# Patient Record
Sex: Female | Born: 1940
Health system: Southern US, Community
[De-identification: ages and names within clinical notes are randomized; demographics above are authoritative.]

## PROBLEM LIST (undated history)

## (undated) DIAGNOSIS — E039 Hypothyroidism, unspecified: Secondary | ICD-10-CM

## (undated) DIAGNOSIS — J449 Chronic obstructive pulmonary disease, unspecified: Secondary | ICD-10-CM

## (undated) DIAGNOSIS — E785 Hyperlipidemia, unspecified: Secondary | ICD-10-CM

## (undated) HISTORY — PX: TUBAL LIGATION: SHX77

## (undated) HISTORY — DX: Hypothyroidism, unspecified: E03.9

---

## 2011-08-07 DIAGNOSIS — E039 Hypothyroidism, unspecified: Secondary | ICD-10-CM | POA: Diagnosis not present

## 2011-08-07 DIAGNOSIS — M810 Age-related osteoporosis without current pathological fracture: Secondary | ICD-10-CM | POA: Diagnosis not present

## 2011-08-07 DIAGNOSIS — E038 Other specified hypothyroidism: Secondary | ICD-10-CM | POA: Diagnosis not present

## 2011-08-07 DIAGNOSIS — E782 Mixed hyperlipidemia: Secondary | ICD-10-CM | POA: Diagnosis not present

## 2011-11-05 DIAGNOSIS — E039 Hypothyroidism, unspecified: Secondary | ICD-10-CM | POA: Diagnosis not present

## 2011-11-05 DIAGNOSIS — E782 Mixed hyperlipidemia: Secondary | ICD-10-CM | POA: Diagnosis not present

## 2011-11-05 DIAGNOSIS — I10 Essential (primary) hypertension: Secondary | ICD-10-CM | POA: Diagnosis not present

## 2012-02-04 DIAGNOSIS — I10 Essential (primary) hypertension: Secondary | ICD-10-CM | POA: Diagnosis not present

## 2012-02-14 DIAGNOSIS — L570 Actinic keratosis: Secondary | ICD-10-CM | POA: Diagnosis not present

## 2012-05-05 DIAGNOSIS — E782 Mixed hyperlipidemia: Secondary | ICD-10-CM | POA: Diagnosis not present

## 2012-05-05 DIAGNOSIS — I10 Essential (primary) hypertension: Secondary | ICD-10-CM | POA: Diagnosis not present

## 2012-05-05 DIAGNOSIS — E039 Hypothyroidism, unspecified: Secondary | ICD-10-CM | POA: Diagnosis not present

## 2012-08-05 DIAGNOSIS — Z0000 Encounter for general adult medical examination without abnormal findings: Secondary | ICD-10-CM | POA: Diagnosis not present

## 2012-08-05 DIAGNOSIS — I10 Essential (primary) hypertension: Secondary | ICD-10-CM | POA: Diagnosis not present

## 2012-11-03 DIAGNOSIS — I10 Essential (primary) hypertension: Secondary | ICD-10-CM | POA: Diagnosis not present

## 2012-11-03 DIAGNOSIS — E782 Mixed hyperlipidemia: Secondary | ICD-10-CM | POA: Diagnosis not present

## 2012-11-03 DIAGNOSIS — E039 Hypothyroidism, unspecified: Secondary | ICD-10-CM | POA: Diagnosis not present

## 2013-02-12 DIAGNOSIS — E782 Mixed hyperlipidemia: Secondary | ICD-10-CM | POA: Diagnosis not present

## 2013-05-15 DIAGNOSIS — E782 Mixed hyperlipidemia: Secondary | ICD-10-CM | POA: Diagnosis not present

## 2013-08-14 DIAGNOSIS — E782 Mixed hyperlipidemia: Secondary | ICD-10-CM | POA: Diagnosis not present

## 2013-08-14 DIAGNOSIS — Z0000 Encounter for general adult medical examination without abnormal findings: Secondary | ICD-10-CM | POA: Diagnosis not present

## 2013-11-26 DIAGNOSIS — E038 Other specified hypothyroidism: Secondary | ICD-10-CM | POA: Diagnosis not present

## 2013-11-26 DIAGNOSIS — M549 Dorsalgia, unspecified: Secondary | ICD-10-CM | POA: Diagnosis not present

## 2014-02-26 DIAGNOSIS — J309 Allergic rhinitis, unspecified: Secondary | ICD-10-CM | POA: Diagnosis not present

## 2014-02-26 DIAGNOSIS — I10 Essential (primary) hypertension: Secondary | ICD-10-CM | POA: Diagnosis not present

## 2014-03-02 DIAGNOSIS — E782 Mixed hyperlipidemia: Secondary | ICD-10-CM | POA: Diagnosis not present

## 2014-03-02 DIAGNOSIS — I10 Essential (primary) hypertension: Secondary | ICD-10-CM | POA: Diagnosis not present

## 2014-05-28 DIAGNOSIS — M810 Age-related osteoporosis without current pathological fracture: Secondary | ICD-10-CM | POA: Diagnosis not present

## 2014-05-28 DIAGNOSIS — I10 Essential (primary) hypertension: Secondary | ICD-10-CM | POA: Diagnosis not present

## 2014-05-28 DIAGNOSIS — E039 Hypothyroidism, unspecified: Secondary | ICD-10-CM | POA: Diagnosis not present

## 2014-06-21 DIAGNOSIS — E2839 Other primary ovarian failure: Secondary | ICD-10-CM | POA: Diagnosis not present

## 2014-06-21 DIAGNOSIS — E780 Pure hypercholesterolemia: Secondary | ICD-10-CM | POA: Diagnosis not present

## 2014-06-21 DIAGNOSIS — N959 Unspecified menopausal and perimenopausal disorder: Secondary | ICD-10-CM | POA: Diagnosis not present

## 2014-06-21 DIAGNOSIS — E039 Hypothyroidism, unspecified: Secondary | ICD-10-CM | POA: Diagnosis not present

## 2014-06-21 DIAGNOSIS — M810 Age-related osteoporosis without current pathological fracture: Secondary | ICD-10-CM | POA: Diagnosis not present

## 2014-06-21 DIAGNOSIS — M85852 Other specified disorders of bone density and structure, left thigh: Secondary | ICD-10-CM | POA: Diagnosis not present

## 2014-06-21 DIAGNOSIS — Z720 Tobacco use: Secondary | ICD-10-CM | POA: Diagnosis not present

## 2014-06-21 DIAGNOSIS — M85851 Other specified disorders of bone density and structure, right thigh: Secondary | ICD-10-CM | POA: Diagnosis not present

## 2014-08-30 DIAGNOSIS — Z1389 Encounter for screening for other disorder: Secondary | ICD-10-CM | POA: Diagnosis not present

## 2014-08-30 DIAGNOSIS — E039 Hypothyroidism, unspecified: Secondary | ICD-10-CM | POA: Diagnosis not present

## 2014-08-30 DIAGNOSIS — M810 Age-related osteoporosis without current pathological fracture: Secondary | ICD-10-CM | POA: Diagnosis not present

## 2014-08-30 DIAGNOSIS — I10 Essential (primary) hypertension: Secondary | ICD-10-CM | POA: Diagnosis not present

## 2014-08-30 DIAGNOSIS — Z0000 Encounter for general adult medical examination without abnormal findings: Secondary | ICD-10-CM | POA: Diagnosis not present

## 2014-09-21 DIAGNOSIS — M810 Age-related osteoporosis without current pathological fracture: Secondary | ICD-10-CM | POA: Diagnosis not present

## 2014-12-20 DIAGNOSIS — E039 Hypothyroidism, unspecified: Secondary | ICD-10-CM | POA: Diagnosis not present

## 2014-12-20 DIAGNOSIS — E784 Other hyperlipidemia: Secondary | ICD-10-CM | POA: Diagnosis not present

## 2014-12-20 DIAGNOSIS — I10 Essential (primary) hypertension: Secondary | ICD-10-CM | POA: Diagnosis not present

## 2015-03-29 DIAGNOSIS — M810 Age-related osteoporosis without current pathological fracture: Secondary | ICD-10-CM | POA: Diagnosis not present

## 2015-05-26 DIAGNOSIS — I10 Essential (primary) hypertension: Secondary | ICD-10-CM | POA: Diagnosis not present

## 2015-05-26 DIAGNOSIS — E784 Other hyperlipidemia: Secondary | ICD-10-CM | POA: Diagnosis not present

## 2015-05-26 DIAGNOSIS — E038 Other specified hypothyroidism: Secondary | ICD-10-CM | POA: Diagnosis not present

## 2015-05-26 DIAGNOSIS — M818 Other osteoporosis without current pathological fracture: Secondary | ICD-10-CM | POA: Diagnosis not present

## 2015-05-27 DIAGNOSIS — Z23 Encounter for immunization: Secondary | ICD-10-CM | POA: Diagnosis not present

## 2015-09-06 DIAGNOSIS — Z1389 Encounter for screening for other disorder: Secondary | ICD-10-CM | POA: Diagnosis not present

## 2015-09-06 DIAGNOSIS — E784 Other hyperlipidemia: Secondary | ICD-10-CM | POA: Diagnosis not present

## 2015-09-06 DIAGNOSIS — Z0000 Encounter for general adult medical examination without abnormal findings: Secondary | ICD-10-CM | POA: Diagnosis not present

## 2015-09-06 DIAGNOSIS — E038 Other specified hypothyroidism: Secondary | ICD-10-CM | POA: Diagnosis not present

## 2015-09-26 DIAGNOSIS — M810 Age-related osteoporosis without current pathological fracture: Secondary | ICD-10-CM | POA: Diagnosis not present

## 2015-12-07 DIAGNOSIS — E038 Other specified hypothyroidism: Secondary | ICD-10-CM | POA: Diagnosis not present

## 2015-12-07 DIAGNOSIS — E784 Other hyperlipidemia: Secondary | ICD-10-CM | POA: Diagnosis not present

## 2015-12-07 DIAGNOSIS — I10 Essential (primary) hypertension: Secondary | ICD-10-CM | POA: Diagnosis not present

## 2015-12-08 DIAGNOSIS — E038 Other specified hypothyroidism: Secondary | ICD-10-CM | POA: Diagnosis not present

## 2015-12-08 DIAGNOSIS — Z79899 Other long term (current) drug therapy: Secondary | ICD-10-CM | POA: Diagnosis not present

## 2015-12-08 DIAGNOSIS — E784 Other hyperlipidemia: Secondary | ICD-10-CM | POA: Diagnosis not present

## 2016-01-04 DIAGNOSIS — E784 Other hyperlipidemia: Secondary | ICD-10-CM | POA: Diagnosis not present

## 2016-01-04 DIAGNOSIS — I10 Essential (primary) hypertension: Secondary | ICD-10-CM | POA: Diagnosis not present

## 2016-01-04 DIAGNOSIS — E038 Other specified hypothyroidism: Secondary | ICD-10-CM | POA: Diagnosis not present

## 2016-01-26 DIAGNOSIS — I10 Essential (primary) hypertension: Secondary | ICD-10-CM | POA: Diagnosis not present

## 2016-01-26 DIAGNOSIS — E784 Other hyperlipidemia: Secondary | ICD-10-CM | POA: Diagnosis not present

## 2016-01-26 DIAGNOSIS — E038 Other specified hypothyroidism: Secondary | ICD-10-CM | POA: Diagnosis not present

## 2016-02-28 DIAGNOSIS — Z23 Encounter for immunization: Secondary | ICD-10-CM | POA: Diagnosis not present

## 2016-03-01 DIAGNOSIS — I10 Essential (primary) hypertension: Secondary | ICD-10-CM | POA: Diagnosis not present

## 2016-03-01 DIAGNOSIS — E784 Other hyperlipidemia: Secondary | ICD-10-CM | POA: Diagnosis not present

## 2016-03-01 DIAGNOSIS — E038 Other specified hypothyroidism: Secondary | ICD-10-CM | POA: Diagnosis not present

## 2016-03-23 DIAGNOSIS — E784 Other hyperlipidemia: Secondary | ICD-10-CM | POA: Diagnosis not present

## 2016-03-23 DIAGNOSIS — M810 Age-related osteoporosis without current pathological fracture: Secondary | ICD-10-CM | POA: Diagnosis not present

## 2016-03-23 DIAGNOSIS — E038 Other specified hypothyroidism: Secondary | ICD-10-CM | POA: Diagnosis not present

## 2016-04-04 DIAGNOSIS — M810 Age-related osteoporosis without current pathological fracture: Secondary | ICD-10-CM | POA: Diagnosis not present

## 2016-04-27 DIAGNOSIS — I10 Essential (primary) hypertension: Secondary | ICD-10-CM | POA: Diagnosis not present

## 2016-04-27 DIAGNOSIS — E784 Other hyperlipidemia: Secondary | ICD-10-CM | POA: Diagnosis not present

## 2016-04-27 DIAGNOSIS — M810 Age-related osteoporosis without current pathological fracture: Secondary | ICD-10-CM | POA: Diagnosis not present

## 2016-04-27 DIAGNOSIS — E038 Other specified hypothyroidism: Secondary | ICD-10-CM | POA: Diagnosis not present

## 2016-04-30 DIAGNOSIS — Z1231 Encounter for screening mammogram for malignant neoplasm of breast: Secondary | ICD-10-CM | POA: Diagnosis not present

## 2016-04-30 DIAGNOSIS — R921 Mammographic calcification found on diagnostic imaging of breast: Secondary | ICD-10-CM | POA: Diagnosis not present

## 2016-06-11 DIAGNOSIS — E038 Other specified hypothyroidism: Secondary | ICD-10-CM | POA: Diagnosis not present

## 2016-06-11 DIAGNOSIS — I10 Essential (primary) hypertension: Secondary | ICD-10-CM | POA: Diagnosis not present

## 2016-06-11 DIAGNOSIS — E784 Other hyperlipidemia: Secondary | ICD-10-CM | POA: Diagnosis not present

## 2016-06-11 DIAGNOSIS — M810 Age-related osteoporosis without current pathological fracture: Secondary | ICD-10-CM | POA: Diagnosis not present

## 2016-06-22 DIAGNOSIS — H40013 Open angle with borderline findings, low risk, bilateral: Secondary | ICD-10-CM | POA: Diagnosis not present

## 2016-07-17 DIAGNOSIS — M810 Age-related osteoporosis without current pathological fracture: Secondary | ICD-10-CM | POA: Diagnosis not present

## 2016-07-17 DIAGNOSIS — E038 Other specified hypothyroidism: Secondary | ICD-10-CM | POA: Diagnosis not present

## 2016-07-17 DIAGNOSIS — E784 Other hyperlipidemia: Secondary | ICD-10-CM | POA: Diagnosis not present

## 2016-08-03 DIAGNOSIS — Z1211 Encounter for screening for malignant neoplasm of colon: Secondary | ICD-10-CM | POA: Diagnosis not present

## 2016-08-16 DIAGNOSIS — M810 Age-related osteoporosis without current pathological fracture: Secondary | ICD-10-CM | POA: Diagnosis not present

## 2016-08-16 DIAGNOSIS — E784 Other hyperlipidemia: Secondary | ICD-10-CM | POA: Diagnosis not present

## 2016-08-16 DIAGNOSIS — E038 Other specified hypothyroidism: Secondary | ICD-10-CM | POA: Diagnosis not present

## 2016-10-09 DIAGNOSIS — M810 Age-related osteoporosis without current pathological fracture: Secondary | ICD-10-CM | POA: Diagnosis not present

## 2016-10-15 DIAGNOSIS — E784 Other hyperlipidemia: Secondary | ICD-10-CM | POA: Diagnosis not present

## 2016-10-15 DIAGNOSIS — E038 Other specified hypothyroidism: Secondary | ICD-10-CM | POA: Diagnosis not present

## 2016-10-15 DIAGNOSIS — R7303 Prediabetes: Secondary | ICD-10-CM | POA: Diagnosis not present

## 2016-10-15 DIAGNOSIS — Z131 Encounter for screening for diabetes mellitus: Secondary | ICD-10-CM | POA: Diagnosis not present

## 2016-10-15 DIAGNOSIS — Z0000 Encounter for general adult medical examination without abnormal findings: Secondary | ICD-10-CM | POA: Diagnosis not present

## 2016-10-15 DIAGNOSIS — M810 Age-related osteoporosis without current pathological fracture: Secondary | ICD-10-CM | POA: Diagnosis not present

## 2016-10-15 DIAGNOSIS — Z1389 Encounter for screening for other disorder: Secondary | ICD-10-CM | POA: Diagnosis not present

## 2016-11-26 DIAGNOSIS — H04123 Dry eye syndrome of bilateral lacrimal glands: Secondary | ICD-10-CM | POA: Diagnosis not present

## 2016-11-26 DIAGNOSIS — H26492 Other secondary cataract, left eye: Secondary | ICD-10-CM | POA: Diagnosis not present

## 2016-11-26 DIAGNOSIS — H26491 Other secondary cataract, right eye: Secondary | ICD-10-CM | POA: Diagnosis not present

## 2016-11-26 DIAGNOSIS — H40023 Open angle with borderline findings, high risk, bilateral: Secondary | ICD-10-CM | POA: Diagnosis not present

## 2016-12-11 DIAGNOSIS — R10811 Right upper quadrant abdominal tenderness: Secondary | ICD-10-CM | POA: Diagnosis not present

## 2016-12-18 DIAGNOSIS — R10811 Right upper quadrant abdominal tenderness: Secondary | ICD-10-CM | POA: Diagnosis not present

## 2016-12-18 DIAGNOSIS — K838 Other specified diseases of biliary tract: Secondary | ICD-10-CM | POA: Diagnosis not present

## 2016-12-18 DIAGNOSIS — R1011 Right upper quadrant pain: Secondary | ICD-10-CM | POA: Diagnosis not present

## 2016-12-28 DIAGNOSIS — H26492 Other secondary cataract, left eye: Secondary | ICD-10-CM | POA: Diagnosis not present

## 2017-01-02 ENCOUNTER — Encounter (INDEPENDENT_AMBULATORY_CARE_PROVIDER_SITE_OTHER): Payer: Self-pay | Admitting: Internal Medicine

## 2017-01-02 ENCOUNTER — Other Ambulatory Visit (INDEPENDENT_AMBULATORY_CARE_PROVIDER_SITE_OTHER): Payer: Self-pay | Admitting: Internal Medicine

## 2017-01-02 ENCOUNTER — Encounter (INDEPENDENT_AMBULATORY_CARE_PROVIDER_SITE_OTHER): Payer: Self-pay

## 2017-01-02 ENCOUNTER — Ambulatory Visit (HOSPITAL_COMMUNITY)
Admission: RE | Admit: 2017-01-02 | Discharge: 2017-01-02 | Disposition: A | Discharge status: Home / Self Care | Payer: Medicare Other | Source: Ambulatory Visit | Attending: Internal Medicine | Admitting: Internal Medicine

## 2017-01-02 ENCOUNTER — Ambulatory Visit (INDEPENDENT_AMBULATORY_CARE_PROVIDER_SITE_OTHER): Payer: Medicare Other | Admitting: Internal Medicine

## 2017-01-02 VITALS — BP 164/80 | HR 72 | Temp 98.1°F | Ht 64.0 in | Wt 143.6 lb

## 2017-01-02 DIAGNOSIS — K8050 Calculus of bile duct without cholangitis or cholecystitis without obstruction: Secondary | ICD-10-CM

## 2017-01-02 DIAGNOSIS — I700 Atherosclerosis of aorta: Secondary | ICD-10-CM | POA: Diagnosis not present

## 2017-01-02 DIAGNOSIS — N281 Cyst of kidney, acquired: Secondary | ICD-10-CM | POA: Diagnosis not present

## 2017-01-02 DIAGNOSIS — R930 Abnormal findings on diagnostic imaging of skull and head, not elsewhere classified: Secondary | ICD-10-CM | POA: Diagnosis not present

## 2017-01-02 DIAGNOSIS — E039 Hypothyroidism, unspecified: Secondary | ICD-10-CM | POA: Insufficient documentation

## 2017-01-02 DIAGNOSIS — K838 Other specified diseases of biliary tract: Secondary | ICD-10-CM | POA: Insufficient documentation

## 2017-01-02 DIAGNOSIS — K449 Diaphragmatic hernia without obstruction or gangrene: Secondary | ICD-10-CM | POA: Diagnosis not present

## 2017-01-02 HISTORY — DX: Hypothyroidism, unspecified: E03.9

## 2017-01-02 LAB — CBC WITH DIFFERENTIAL/PLATELET
BASOS PCT: 1 %
Basophils Absolute: 76 cells/uL (ref 0–200)
EOS ABS: 152 {cells}/uL (ref 15–500)
Eosinophils Relative: 2 %
HEMATOCRIT: 45 % (ref 35.0–45.0)
HEMOGLOBIN: 14.6 g/dL (ref 11.7–15.5)
LYMPHS PCT: 37 %
Lymphs Abs: 2812 cells/uL (ref 850–3900)
MCH: 27.2 pg (ref 27.0–33.0)
MCHC: 32.4 g/dL (ref 32.0–36.0)
MCV: 84 fL (ref 80.0–100.0)
MONO ABS: 760 {cells}/uL (ref 200–950)
MPV: 10 fL (ref 7.5–12.5)
Monocytes Relative: 10 %
Neutro Abs: 3800 cells/uL (ref 1500–7800)
Neutrophils Relative %: 50 %
Platelets: 364 10*3/uL (ref 140–400)
RBC: 5.36 MIL/uL — ABNORMAL HIGH (ref 3.80–5.10)
RDW: 13.3 % (ref 11.0–15.0)
WBC: 7.6 10*3/uL (ref 3.8–10.8)

## 2017-01-02 LAB — HEPATIC FUNCTION PANEL
ALT: 11 U/L (ref 6–29)
AST: 16 U/L (ref 10–35)
Albumin: 4.1 g/dL (ref 3.6–5.1)
Alkaline Phosphatase: 69 U/L (ref 33–130)
BILIRUBIN DIRECT: 0.1 mg/dL (ref <=0.2)
BILIRUBIN INDIRECT: 0.4 mg/dL (ref 0.2–1.2)
Total Bilirubin: 0.5 mg/dL (ref 0.2–1.2)
Total Protein: 6.7 g/dL (ref 6.1–8.1)

## 2017-01-02 LAB — POCT I-STAT CREATININE: Creatinine, Ser: 0.8 mg/dL (ref 0.44–1.00)

## 2017-01-02 MED ORDER — GADOBENATE DIMEGLUMINE 529 MG/ML IV SOLN
15.0000 mL | Freq: Once | INTRAVENOUS | Status: AC | PRN
  Administered 2017-01-02: 14 mL via INTRAVENOUS

## 2017-01-02 NOTE — Progress Notes (Signed)
   Subjective:    Patient ID: Sherry Bridges, female    DOB: 07/27/1940, 76 y.o.   MRN: [id number]  HPI  Referred by Dr. Olena LeatherwoodHasanaj for possible ERCP. She states she not having any pain. A few weeks ago she had epigastric pain after she ate. She has not had any pain in about 2 weeks. When she had the US she states she was not having any pain.  No pain at this time.  Appetite is good. No weight loss.  BMs every other day. Her last colonoscopy was normal by Dr. Gabriel CirrieMason this year.    12/18/2016 US complete Abdomen:  RUQ tenderness Milded dilated CBD with intraluminal stones or sludge. Normal appearance of the GB, liver , pancreas, and spleen. CBC 6.3-7.0  Review of Systems Past Medical History:  Diagnosis Date  . Hypothyroidism 01/02/2017    No past surgical history on file.  No Known Allergies  No current outpatient prescriptions on file prior to visit.   No current facility-administered medications on file prior to visit.    Current Outpatient Prescriptions  Medication Sig Dispense Refill  . atorvastatin (LIPITOR) 40 MG tablet Take 40 mg by mouth daily.    Marland Kitchen. co-enzyme Q-10 30 MG capsule Take 30 mg by mouth 3 (three) times daily.    Marland Kitchen. levothyroxine (SYNTHROID, LEVOTHROID) 100 MCG tablet Take 100 mcg by mouth daily before breakfast.    . Multiple Vitamin (MULTIVITAMIN) tablet Take 1 tablet by mouth daily.    . Omega-3 Fatty Acids (FISH OIL) 1000 MG CAPS Take by mouth.     No current facility-administered medications for this visit.          Objective:   Physical Exam Blood pressure (!) 164/80, pulse 72, temperature 98.1 F (36.7 C), height 5\' 4"  (1.626 m), weight 143 lb 9.6 oz (65.1 kg). Alert and oriented. Skin warm and dry. Oral mucosa is moist.   . Sclera anicteric, conjunctivae is pink. Thyroid not enlarged. No cervical lymphadenopathy. Lungs clear. Heart regular rate and rhythm.  Abdomen is soft. Bowel sounds are positive. No hepatomegaly. No abdominal masses felt. No  tenderness.  No edema to lower extremities.          Assessment & Plan:  Possible stone in CBD. MRI abdomen, CBC and hepatic function.  Further recommendations to follow.

## 2017-01-02 NOTE — Patient Instructions (Signed)
MRI, CBC and Hepatic

## 2017-01-03 ENCOUNTER — Telehealth (INDEPENDENT_AMBULATORY_CARE_PROVIDER_SITE_OTHER): Payer: Self-pay | Admitting: Internal Medicine

## 2017-01-03 ENCOUNTER — Other Ambulatory Visit (INDEPENDENT_AMBULATORY_CARE_PROVIDER_SITE_OTHER): Payer: Self-pay | Admitting: *Deleted

## 2017-01-03 DIAGNOSIS — R748 Abnormal levels of other serum enzymes: Secondary | ICD-10-CM

## 2017-01-03 NOTE — Telephone Encounter (Signed)
3 mth US noted in recall

## 2017-01-03 NOTE — Telephone Encounter (Signed)
Sherry Bridges, Needs US in 3 months

## 2017-01-04 ENCOUNTER — Encounter (INDEPENDENT_AMBULATORY_CARE_PROVIDER_SITE_OTHER): Payer: Self-pay

## 2017-01-23 DIAGNOSIS — E784 Other hyperlipidemia: Secondary | ICD-10-CM | POA: Diagnosis not present

## 2017-01-23 DIAGNOSIS — M810 Age-related osteoporosis without current pathological fracture: Secondary | ICD-10-CM | POA: Diagnosis not present

## 2017-01-23 DIAGNOSIS — E038 Other specified hypothyroidism: Secondary | ICD-10-CM | POA: Diagnosis not present

## 2017-03-21 ENCOUNTER — Encounter (INDEPENDENT_AMBULATORY_CARE_PROVIDER_SITE_OTHER): Payer: Self-pay | Admitting: *Deleted

## 2017-03-21 ENCOUNTER — Other Ambulatory Visit (INDEPENDENT_AMBULATORY_CARE_PROVIDER_SITE_OTHER): Payer: Self-pay | Admitting: *Deleted

## 2017-03-21 DIAGNOSIS — R748 Abnormal levels of other serum enzymes: Secondary | ICD-10-CM

## 2017-03-22 ENCOUNTER — Encounter (INDEPENDENT_AMBULATORY_CARE_PROVIDER_SITE_OTHER): Payer: Self-pay | Admitting: *Deleted

## 2017-04-01 ENCOUNTER — Telehealth (INDEPENDENT_AMBULATORY_CARE_PROVIDER_SITE_OTHER): Payer: Self-pay | Admitting: Internal Medicine

## 2017-04-01 NOTE — Telephone Encounter (Signed)
Patient called and lmoam stating that she received a letter for labs, she stated that she didn't think she was going to follow up on this anymore.  [phone]

## 2017-04-01 NOTE — Telephone Encounter (Signed)
Advised I wanted a follow up and this would be the last one.

## 2017-04-22 DIAGNOSIS — M810 Age-related osteoporosis without current pathological fracture: Secondary | ICD-10-CM | POA: Diagnosis not present

## 2017-05-06 DIAGNOSIS — E038 Other specified hypothyroidism: Secondary | ICD-10-CM | POA: Diagnosis not present

## 2017-05-06 DIAGNOSIS — Z23 Encounter for immunization: Secondary | ICD-10-CM | POA: Diagnosis not present

## 2017-05-06 DIAGNOSIS — M810 Age-related osteoporosis without current pathological fracture: Secondary | ICD-10-CM | POA: Diagnosis not present

## 2017-05-06 DIAGNOSIS — E7849 Other hyperlipidemia: Secondary | ICD-10-CM | POA: Diagnosis not present

## 2017-08-12 DIAGNOSIS — M810 Age-related osteoporosis without current pathological fracture: Secondary | ICD-10-CM | POA: Diagnosis not present

## 2017-08-12 DIAGNOSIS — R7303 Prediabetes: Secondary | ICD-10-CM | POA: Diagnosis not present

## 2017-08-12 DIAGNOSIS — Z131 Encounter for screening for diabetes mellitus: Secondary | ICD-10-CM | POA: Diagnosis not present

## 2017-08-12 DIAGNOSIS — E038 Other specified hypothyroidism: Secondary | ICD-10-CM | POA: Diagnosis not present

## 2017-08-12 DIAGNOSIS — E7849 Other hyperlipidemia: Secondary | ICD-10-CM | POA: Diagnosis not present

## 2017-09-23 DIAGNOSIS — M810 Age-related osteoporosis without current pathological fracture: Secondary | ICD-10-CM | POA: Diagnosis not present

## 2017-09-23 DIAGNOSIS — M8588 Other specified disorders of bone density and structure, other site: Secondary | ICD-10-CM | POA: Diagnosis not present

## 2017-10-30 DIAGNOSIS — M810 Age-related osteoporosis without current pathological fracture: Secondary | ICD-10-CM | POA: Diagnosis not present

## 2017-11-13 DIAGNOSIS — E038 Other specified hypothyroidism: Secondary | ICD-10-CM | POA: Diagnosis not present

## 2017-11-13 DIAGNOSIS — M810 Age-related osteoporosis without current pathological fracture: Secondary | ICD-10-CM | POA: Diagnosis not present

## 2017-11-13 DIAGNOSIS — E7849 Other hyperlipidemia: Secondary | ICD-10-CM | POA: Diagnosis not present

## 2018-03-27 DIAGNOSIS — M810 Age-related osteoporosis without current pathological fracture: Secondary | ICD-10-CM | POA: Diagnosis not present

## 2018-03-27 DIAGNOSIS — E7849 Other hyperlipidemia: Secondary | ICD-10-CM | POA: Diagnosis not present

## 2018-03-27 DIAGNOSIS — Z0000 Encounter for general adult medical examination without abnormal findings: Secondary | ICD-10-CM | POA: Diagnosis not present

## 2018-03-27 DIAGNOSIS — E038 Other specified hypothyroidism: Secondary | ICD-10-CM | POA: Diagnosis not present

## 2018-03-27 DIAGNOSIS — Z1389 Encounter for screening for other disorder: Secondary | ICD-10-CM | POA: Diagnosis not present

## 2018-05-06 DIAGNOSIS — M810 Age-related osteoporosis without current pathological fracture: Secondary | ICD-10-CM | POA: Diagnosis not present

## 2018-07-29 DIAGNOSIS — F320 Major depressive disorder, single episode, mild: Secondary | ICD-10-CM | POA: Diagnosis not present

## 2018-07-29 DIAGNOSIS — E038 Other specified hypothyroidism: Secondary | ICD-10-CM | POA: Diagnosis not present

## 2018-07-29 DIAGNOSIS — K219 Gastro-esophageal reflux disease without esophagitis: Secondary | ICD-10-CM | POA: Diagnosis not present

## 2018-07-29 DIAGNOSIS — M810 Age-related osteoporosis without current pathological fracture: Secondary | ICD-10-CM | POA: Diagnosis not present

## 2018-07-29 DIAGNOSIS — E7849 Other hyperlipidemia: Secondary | ICD-10-CM | POA: Diagnosis not present

## 2018-11-19 DIAGNOSIS — M810 Age-related osteoporosis without current pathological fracture: Secondary | ICD-10-CM | POA: Diagnosis not present

## 2018-11-19 DIAGNOSIS — E038 Other specified hypothyroidism: Secondary | ICD-10-CM | POA: Diagnosis not present

## 2018-11-19 DIAGNOSIS — Z131 Encounter for screening for diabetes mellitus: Secondary | ICD-10-CM | POA: Diagnosis not present

## 2018-11-19 DIAGNOSIS — E7849 Other hyperlipidemia: Secondary | ICD-10-CM | POA: Diagnosis not present

## 2018-11-19 DIAGNOSIS — F320 Major depressive disorder, single episode, mild: Secondary | ICD-10-CM | POA: Diagnosis not present

## 2018-11-19 DIAGNOSIS — K219 Gastro-esophageal reflux disease without esophagitis: Secondary | ICD-10-CM | POA: Diagnosis not present

## 2018-11-19 DIAGNOSIS — R7303 Prediabetes: Secondary | ICD-10-CM | POA: Diagnosis not present

## 2018-11-24 DIAGNOSIS — R1110 Vomiting, unspecified: Secondary | ICD-10-CM | POA: Diagnosis not present

## 2018-11-24 DIAGNOSIS — E785 Hyperlipidemia, unspecified: Secondary | ICD-10-CM | POA: Diagnosis not present

## 2018-11-24 DIAGNOSIS — M810 Age-related osteoporosis without current pathological fracture: Secondary | ICD-10-CM | POA: Diagnosis not present

## 2018-11-24 DIAGNOSIS — D72829 Elevated white blood cell count, unspecified: Secondary | ICD-10-CM | POA: Diagnosis not present

## 2018-11-24 DIAGNOSIS — R112 Nausea with vomiting, unspecified: Secondary | ICD-10-CM | POA: Diagnosis not present

## 2018-11-24 DIAGNOSIS — Z79899 Other long term (current) drug therapy: Secondary | ICD-10-CM | POA: Diagnosis not present

## 2018-11-24 DIAGNOSIS — E039 Hypothyroidism, unspecified: Secondary | ICD-10-CM | POA: Diagnosis not present

## 2018-11-24 DIAGNOSIS — Z87891 Personal history of nicotine dependence: Secondary | ICD-10-CM | POA: Diagnosis not present

## 2018-11-24 DIAGNOSIS — K8042 Calculus of bile duct with acute cholecystitis without obstruction: Secondary | ICD-10-CM | POA: Diagnosis not present

## 2018-11-24 DIAGNOSIS — K4090 Unilateral inguinal hernia, without obstruction or gangrene, not specified as recurrent: Secondary | ICD-10-CM | POA: Diagnosis not present

## 2018-11-24 DIAGNOSIS — K449 Diaphragmatic hernia without obstruction or gangrene: Secondary | ICD-10-CM | POA: Diagnosis not present

## 2018-11-24 DIAGNOSIS — I10 Essential (primary) hypertension: Secondary | ICD-10-CM | POA: Diagnosis not present

## 2018-11-24 DIAGNOSIS — K810 Acute cholecystitis: Secondary | ICD-10-CM | POA: Diagnosis not present

## 2018-11-24 DIAGNOSIS — Z780 Asymptomatic menopausal state: Secondary | ICD-10-CM | POA: Diagnosis not present

## 2018-11-25 ENCOUNTER — Encounter (HOSPITAL_COMMUNITY): Payer: Self-pay | Admitting: *Deleted

## 2018-11-25 ENCOUNTER — Other Ambulatory Visit: Payer: Self-pay

## 2018-11-25 ENCOUNTER — Inpatient Hospital Stay (HOSPITAL_COMMUNITY)
Admission: AD | Admit: 2018-11-25 | Discharge: 2018-11-27 | DRG: 419 | Disposition: A | Discharge status: Home / Self Care | Payer: Medicare Other | Source: Other Acute Inpatient Hospital | Attending: Internal Medicine | Admitting: Internal Medicine

## 2018-11-25 DIAGNOSIS — Z79899 Other long term (current) drug therapy: Secondary | ICD-10-CM

## 2018-11-25 DIAGNOSIS — I959 Hypotension, unspecified: Secondary | ICD-10-CM | POA: Diagnosis not present

## 2018-11-25 DIAGNOSIS — K4090 Unilateral inguinal hernia, without obstruction or gangrene, not specified as recurrent: Secondary | ICD-10-CM | POA: Diagnosis not present

## 2018-11-25 DIAGNOSIS — R945 Abnormal results of liver function studies: Secondary | ICD-10-CM | POA: Diagnosis present

## 2018-11-25 DIAGNOSIS — K8012 Calculus of gallbladder with acute and chronic cholecystitis without obstruction: Secondary | ICD-10-CM | POA: Diagnosis not present

## 2018-11-25 DIAGNOSIS — R112 Nausea with vomiting, unspecified: Secondary | ICD-10-CM

## 2018-11-25 DIAGNOSIS — K8062 Calculus of gallbladder and bile duct with acute cholecystitis without obstruction: Principal | ICD-10-CM | POA: Diagnosis present

## 2018-11-25 DIAGNOSIS — K828 Other specified diseases of gallbladder: Secondary | ICD-10-CM | POA: Diagnosis not present

## 2018-11-25 DIAGNOSIS — Z1159 Encounter for screening for other viral diseases: Secondary | ICD-10-CM | POA: Diagnosis not present

## 2018-11-25 DIAGNOSIS — K810 Acute cholecystitis: Secondary | ICD-10-CM | POA: Diagnosis not present

## 2018-11-25 DIAGNOSIS — K8060 Calculus of gallbladder and bile duct with cholecystitis, unspecified, without obstruction: Secondary | ICD-10-CM | POA: Diagnosis not present

## 2018-11-25 DIAGNOSIS — K8042 Calculus of bile duct with acute cholecystitis without obstruction: Secondary | ICD-10-CM | POA: Diagnosis not present

## 2018-11-25 DIAGNOSIS — E039 Hypothyroidism, unspecified: Secondary | ICD-10-CM | POA: Diagnosis present

## 2018-11-25 DIAGNOSIS — K8000 Calculus of gallbladder with acute cholecystitis without obstruction: Secondary | ICD-10-CM | POA: Diagnosis present

## 2018-11-25 DIAGNOSIS — K449 Diaphragmatic hernia without obstruction or gangrene: Secondary | ICD-10-CM | POA: Diagnosis not present

## 2018-11-25 DIAGNOSIS — D649 Anemia, unspecified: Secondary | ICD-10-CM | POA: Diagnosis not present

## 2018-11-25 DIAGNOSIS — E785 Hyperlipidemia, unspecified: Secondary | ICD-10-CM | POA: Diagnosis present

## 2018-11-25 DIAGNOSIS — I10 Essential (primary) hypertension: Secondary | ICD-10-CM | POA: Diagnosis present

## 2018-11-25 DIAGNOSIS — K8050 Calculus of bile duct without cholangitis or cholecystitis without obstruction: Secondary | ICD-10-CM | POA: Diagnosis not present

## 2018-11-25 DIAGNOSIS — K8689 Other specified diseases of pancreas: Secondary | ICD-10-CM | POA: Diagnosis present

## 2018-11-25 DIAGNOSIS — M810 Age-related osteoporosis without current pathological fracture: Secondary | ICD-10-CM | POA: Diagnosis not present

## 2018-11-25 LAB — SARS CORONAVIRUS 2 BY RT PCR (HOSPITAL ORDER, PERFORMED IN ~~LOC~~ HOSPITAL LAB): SARS Coronavirus 2: NEGATIVE

## 2018-11-25 MED ORDER — SODIUM CHLORIDE 0.9 % IV SOLN
2.0000 g | INTRAVENOUS | Status: DC
  Administered 2018-11-25 – 2018-11-26 (×2): 2 g via INTRAVENOUS
  Filled 2018-11-25 (×2): qty 20

## 2018-11-25 MED ORDER — MORPHINE SULFATE (PF) 2 MG/ML IV SOLN
1.0000 mg | INTRAVENOUS | Status: DC | PRN
  Administered 2018-11-25 – 2018-11-26 (×3): 1 mg via INTRAVENOUS
  Filled 2018-11-25 (×3): qty 1

## 2018-11-25 MED ORDER — ATORVASTATIN CALCIUM 40 MG PO TABS
40.0000 mg | ORAL_TABLET | Freq: Every day | ORAL | Status: DC
  Administered 2018-11-25: 40 mg via ORAL
  Filled 2018-11-25: qty 1

## 2018-11-25 MED ORDER — ONDANSETRON HCL 4 MG/2ML IJ SOLN
4.0000 mg | Freq: Four times a day (QID) | INTRAMUSCULAR | Status: DC | PRN
  Administered 2018-11-27: 4 mg via INTRAVENOUS
  Filled 2018-11-25: qty 2

## 2018-11-25 MED ORDER — SODIUM CHLORIDE 0.9 % IV SOLN
INTRAVENOUS | Status: DC
  Administered 2018-11-25 – 2018-11-27 (×5): via INTRAVENOUS

## 2018-11-25 MED ORDER — LEVOTHYROXINE SODIUM 88 MCG PO TABS: 88.0000 ug | ORAL_TABLET | Freq: Every day | ORAL | Status: DC

## 2018-11-25 NOTE — H&P (Signed)
History and PhysicalJennet Bridges:    Sherry Bridges   UEA:[id number] DOB: October 13, 1940 DOA: 11/25/2018  Referring Bridges/provider: Dr Sherry Bridges PCP: Sherry Bridges   Patient coming from: Home  Chief Complaint: Nausea vomiting and right upper quadrant pain  History of Present Illness:   Sherry Bridges is an 78 y.o. female with past medical history significant for hypertension and hyperlipidemia who was in her usual state of health until yesterday when she started having nausea and then vomiting.  Patient noted that the vomiting increased and then she developed right upper quadrant pain which became increasingly severe.  [facility] and was admitted for concerns for cholecystitis.  Patient was treated symptomatically with Zofran, Rocephin and IV fluids.  ERCP which showed 3 common bile duct stones the largest being 5 mm with common bile duct dilatation.  Dilatation of the pancreatic duct.  Patient was discussed with Dr.Rehman who accepted him for transfer to [facility] for ERCP in the morning.  Patient states she is doing better with no vomiting since yesterday, improved nausea and reasonable pain management.  Patient denies fevers chills diarrhea or feeling unwell until yesterday when the vomiting and right upper quadrant pain started.   ROS:   ROS   Review of Systems: As per HPI  Past Medical History:   Past Medical History:  Diagnosis Date  . Hypothyroidism 01/02/2017    Past Surgical History:   Past Surgical History:  Procedure Laterality Date  . TUBAL LIGATION      Social History:   Social History   Socioeconomic History  . Marital status: Married    Spouse name: Not on file  . Number of children: Not on file  . Years of education: Not on file  . Highest education level: Not on file  Occupational History  . Not on file  Social Needs  . Financial resource strain: Not on file  . Food insecurity:    Worry: Not on file    Inability: Not on  file  . Transportation needs:    Medical: Not on file    Non-medical: Not on file  Tobacco Use  . Smoking status: Never Smoker  . Smokeless tobacco: Never Used  Substance and Sexual Activity  . Alcohol use: Yes    Comment: OCCASIONAL  . Drug use: No  . Sexual activity: Not on file  Lifestyle  . Physical activity:    Days per week: Not on file    Minutes per session: Not on file  . Stress: Not on file  Relationships  . Social connections:    Talks on phone: Not on file    Gets together: Not on file    Attends religious service: Not on file    Active member of club or organization: Not on file    Attends meetings of clubs or organizations: Not on file    Relationship status: Not on file  . Intimate partner violence:    Fear of current or ex partner: Not on file    Emotionally abused: Not on file    Physically abused: Not on file    Forced sexual activity: Not on file  Other Topics Concern  . Not on file  Social History Narrative  . Not on file    Allergies   Patient has no known allergies.  Family history:   History reviewed. No pertinent family history.  Current Medications:   Prior to Admission medications   Medication  Sig Start Date End Date Taking? Authorizing Provider  atorvastatin (LIPITOR) 40 MG tablet Take 40 mg by mouth daily.   Yes Provider, Historical, Bridges  co-enzyme Q-10 30 MG capsule Take 30 mg by mouth daily.    Yes Provider, Historical, Bridges  EUTHYROX 88 MCG tablet Take 88 mcg by mouth daily. 11/19/18  Yes Provider, Historical, Bridges  Multiple Vitamin (MULTIVITAMIN) tablet Take 1 tablet by mouth daily.   Yes Provider, Historical, Bridges  Omega-3 Fatty Acids (FISH OIL) 1000 MG CAPS Take 1 capsule by mouth daily.    Yes Provider, Historical, Bridges    Physical Exam:   Vitals:   11/25/18 1951  BP: (!) 121/54  Pulse: 81  Resp: 18  Temp: 99.2 F (37.3 C)  TempSrc: Oral  SpO2: 94%  Weight: 68.4 kg  Height: 5\' 4"  (1.626 m)     Physical Exam: Blood  pressure (!) 121/54, pulse 81, temperature 99.2 F (37.3 C), temperature source Oral, resp. rate 18, height 5\' 4"  (1.626 m), weight 68.4 kg, SpO2 94 %. Gen: Somewhat tired appearing female looking older than stated age lying in bed in no acute distress. Eyes: Sclerae anicteric. Conjunctiva mildly injected. Neck: Supple, no jugular venous distention. Chest: Moderately good air entry bilaterally with no adventitious sounds.  CV: Distant, regular, no audible murmurs. Abdomen: NABS, soft, nondistended patient does have right upper quadrant tenderness to light palpation.  Positive Murphy sign.  Mild voluntary guarding focally at the right upper quadrant.  Rest of abdomen is soft and nontender.   Extremities: No edema.  Neuro: Alert and oriented times 3; grossly nonfocal. Psych: Patient is cooperative, logical and coherent, mood and affect are somewhat depressed.  Data Review:    Labs: Basic Metabolic Panel: No results for input(s): NA, K, CL, CO2, GLUCOSE, BUN, CREATININE, CALCIUM, MG, PHOS in the last 168 hours. Liver Function Tests: No results for input(s): AST, ALT, ALKPHOS, BILITOT, PROT, ALBUMIN in the last 168 hours. No results for input(s): LIPASE, AMYLASE in the last 168 hours. No results for input(s): AMMONIA in the last 168 hours. CBC: No results for input(s): WBC, NEUTROABS, HGB, HCT, MCV, PLT in the last 168 hours. Cardiac Enzymes: No results for input(s): CKTOTAL, CKMB, CKMBINDEX, TROPONINI in the last 168 hours.  BNP (last 3 results) No results for input(s): PROBNP in the last 8760 hours. CBG: No results for input(s): GLUCAP in the last 168 hours.  Urinalysis No results found for: COLORURINE, APPEARANCEUR, LABSPEC, PHURINE, GLUCOSEU, HGBUR, BILIRUBINUR, KETONESUR, PROTEINUR, UROBILINOGEN, NITRITE, LEUKOCYTESUR    Radiographic Studies: No results found.  EKG: Ordered and pending  Assessment/Plan:   Active Problems:   Acute cholecystitis   Essential hypertension    Hyperlipidemia   Nausea and vomiting   Acute cholecystitis due to biliary calculus   78 year old female with acute cholecystitis is transferred to [place] for ERCP in the morning.  She is presently comfortable without any further nausea no vomiting since yesterday.  Her pain is reasonably controlled.  Plan on clear liquids tonight with n.p.o. after midnight per Dr. Karilyn Cota who is aware of patient.  To new home medications of atorvastatin and Synthroid to start in the morning after ERCP.     Other information:   DVT prophylaxis: SCD Code Status: Full code. Family Communication: N/A Disposition Plan: Home Consults called: GI Dr. Karilyn Cota Admission status: Inpatient   The medical decision making is of moderate complexity, therefore this is a level 2 visit.  Horatio Pel Orma Flaming [facility]  If 7PM-7AM,  please contact night-coverage [email or web address] Password [email or web address] 11/25/2018, 8:07 PM

## 2018-11-25 NOTE — Progress Notes (Signed)
Patient daughter called to floor and voiced concern over patient not being seen by surgery to charge nurse.  Explained to patient that since she was a late transfer from [facility], that orders would be placed tomorrow for surgical consult.  Explained to daughter that am hospitalists would see patient in morning, then order a surgical consult if needed.  Daughter then stated, "That's why mom was transferred, to have surgery." Explained to patient that surgical consults are medical discretion, and if needed will have order written tomorrow.  Patient daughter upset, and stated "it would be 3 days of her needing surgery." Explained to daughter again, that work up would start in am, since patient was a late transfer and didn't arrived to the floor until at 5pm.  Patient stated she would like Dr. Lovell Sheehan to be patients surgeon request.

## 2018-11-25 NOTE — Progress Notes (Signed)
Patients daughter would like Dr. Lovell Sheehan to be consulted for surgery.

## 2018-11-25 NOTE — Progress Notes (Signed)
Patient's daughter Sherry Bridges, [phone] is requesting a surgical consult please.

## 2018-11-25 NOTE — Progress Notes (Signed)
Paged Dr. Luberta Robertson to obtain a surgical consult order. Dr. Luberta Robertson called stating she would like gi to see patient in the morning to determine if patient needs outpatient treatment or inpatient treatment.

## 2018-11-26 ENCOUNTER — Inpatient Hospital Stay (HOSPITAL_COMMUNITY): Payer: Medicare Other | Admitting: Anesthesiology

## 2018-11-26 ENCOUNTER — Inpatient Hospital Stay (HOSPITAL_COMMUNITY): Payer: Medicare Other

## 2018-11-26 ENCOUNTER — Encounter (HOSPITAL_COMMUNITY)
Admission: AD | Disposition: A | Discharge status: Home / Self Care | Payer: Self-pay | Source: Other Acute Inpatient Hospital | Attending: Internal Medicine

## 2018-11-26 DIAGNOSIS — K810 Acute cholecystitis: Secondary | ICD-10-CM

## 2018-11-26 DIAGNOSIS — K8000 Calculus of gallbladder with acute cholecystitis without obstruction: Secondary | ICD-10-CM

## 2018-11-26 DIAGNOSIS — K8050 Calculus of bile duct without cholangitis or cholecystitis without obstruction: Secondary | ICD-10-CM

## 2018-11-26 DIAGNOSIS — E039 Hypothyroidism, unspecified: Secondary | ICD-10-CM

## 2018-11-26 HISTORY — PX: ERCP: SHX5425

## 2018-11-26 HISTORY — PX: BILIARY STENT PLACEMENT: SHX5538

## 2018-11-26 LAB — CBC WITH DIFFERENTIAL/PLATELET
Abs Immature Granulocytes: 0.18 10*3/uL — ABNORMAL HIGH (ref 0.00–0.07)
Basophils Absolute: 0.1 10*3/uL (ref 0.0–0.1)
Basophils Relative: 0 %
Eosinophils Absolute: 0 10*3/uL (ref 0.0–0.5)
Eosinophils Relative: 0 %
HCT: 40.6 % (ref 36.0–46.0)
Hemoglobin: 12.6 g/dL (ref 12.0–15.0)
Immature Granulocytes: 1 %
Lymphocytes Relative: 9 %
Lymphs Abs: 1.8 10*3/uL (ref 0.7–4.0)
MCH: 27.2 pg (ref 26.0–34.0)
MCHC: 31 g/dL (ref 30.0–36.0)
MCV: 87.7 fL (ref 80.0–100.0)
Monocytes Absolute: 2 10*3/uL — ABNORMAL HIGH (ref 0.1–1.0)
Monocytes Relative: 10 %
Neutro Abs: 15.7 10*3/uL — ABNORMAL HIGH (ref 1.7–7.7)
Neutrophils Relative %: 80 %
Platelets: 331 10*3/uL (ref 150–400)
RBC: 4.63 MIL/uL (ref 3.87–5.11)
RDW: 14.2 % (ref 11.5–15.5)
WBC: 19.8 10*3/uL — ABNORMAL HIGH (ref 4.0–10.5)
nRBC: 0 % (ref 0.0–0.2)

## 2018-11-26 LAB — COMPREHENSIVE METABOLIC PANEL
ALT: 13 U/L (ref 0–44)
AST: 17 U/L (ref 15–41)
Albumin: 2.7 g/dL — ABNORMAL LOW (ref 3.5–5.0)
Alkaline Phosphatase: 46 U/L (ref 38–126)
Anion gap: 7 (ref 5–15)
BUN: 12 mg/dL (ref 8–23)
CO2: 24 mmol/L (ref 22–32)
Calcium: 8.3 mg/dL — ABNORMAL LOW (ref 8.9–10.3)
Chloride: 105 mmol/L (ref 98–111)
Creatinine, Ser: 0.75 mg/dL (ref 0.44–1.00)
GFR calc Af Amer: >60 mL/min (ref >60)
GFR calc non Af Amer: >60 mL/min (ref >60)
Glucose, Bld: 92 mg/dL (ref 70–99)
Potassium: 4.2 mmol/L (ref 3.5–5.1)
Sodium: 136 mmol/L (ref 135–145)
Total Bilirubin: 0.7 mg/dL (ref 0.3–1.2)
Total Protein: 5.7 g/dL — ABNORMAL LOW (ref 6.5–8.1)

## 2018-11-26 LAB — PROTIME-INR
INR: 1.3 — ABNORMAL HIGH (ref 0.8–1.2)
Prothrombin Time: 15.9 seconds — ABNORMAL HIGH (ref 11.4–15.2)

## 2018-11-26 LAB — LIPASE, BLOOD: Lipase: 21 U/L (ref 11–51)

## 2018-11-26 SURGERY — ERCP, WITH INTERVENTION IF INDICATED
Anesthesia: General

## 2018-11-26 MED ORDER — NALOXONE HCL 0.4 MG/ML IJ SOLN
INTRAMUSCULAR | Status: AC
  Filled 2018-11-26: qty 1

## 2018-11-26 MED ORDER — GLUCAGON HCL RDNA (DIAGNOSTIC) 1 MG IJ SOLR
INTRAMUSCULAR | Status: DC | PRN
  Administered 2018-11-26 (×4): 0.25 mg via INTRAVENOUS

## 2018-11-26 MED ORDER — PHENYLEPHRINE 40 MCG/ML (10ML) SYRINGE FOR IV PUSH (FOR BLOOD PRESSURE SUPPORT)
PREFILLED_SYRINGE | INTRAVENOUS | Status: AC
  Filled 2018-11-26: qty 10

## 2018-11-26 MED ORDER — PHENYLEPHRINE HCL (PRESSORS) 10 MG/ML IV SOLN
INTRAVENOUS | Status: DC | PRN
  Administered 2018-11-26 (×4): 40 ug via INTRAVENOUS
  Administered 2018-11-26: 80 ug via INTRAVENOUS
  Administered 2018-11-26 (×2): 40 ug via INTRAVENOUS

## 2018-11-26 MED ORDER — PROPOFOL 10 MG/ML IV BOLUS
INTRAVENOUS | Status: AC
  Filled 2018-11-26: qty 20

## 2018-11-26 MED ORDER — FENTANYL CITRATE (PF) 100 MCG/2ML IJ SOLN
INTRAMUSCULAR | Status: AC
  Filled 2018-11-26: qty 2

## 2018-11-26 MED ORDER — SODIUM CHLORIDE (PF) 0.9 % IJ SOLN
INTRAMUSCULAR | Status: AC
  Filled 2018-11-26: qty 10

## 2018-11-26 MED ORDER — PROPOFOL 10 MG/ML IV BOLUS
INTRAVENOUS | Status: DC | PRN
  Administered 2018-11-26: 90 mg via INTRAVENOUS

## 2018-11-26 MED ORDER — MORPHINE SULFATE (PF) 2 MG/ML IV SOLN
1.0000 mg | INTRAVENOUS | Status: DC | PRN
  Administered 2018-11-26: 1 mg via INTRAVENOUS
  Filled 2018-11-26: qty 1

## 2018-11-26 MED ORDER — GLYCOPYRROLATE PF 0.2 MG/ML IJ SOSY
PREFILLED_SYRINGE | INTRAMUSCULAR | Status: AC
  Filled 2018-11-26: qty 1

## 2018-11-26 MED ORDER — ROCURONIUM BROMIDE 10 MG/ML (PF) SYRINGE
PREFILLED_SYRINGE | INTRAVENOUS | Status: AC
  Filled 2018-11-26: qty 10

## 2018-11-26 MED ORDER — SODIUM CHLORIDE 0.9 % IV SOLN
INTRAVENOUS | Status: AC
  Filled 2018-11-26: qty 50

## 2018-11-26 MED ORDER — SODIUM CHLORIDE 0.9 % IV SOLN: INTRAVENOUS | Status: DC | PRN

## 2018-11-26 MED ORDER — CHLORHEXIDINE GLUCONATE CLOTH 2 % EX PADS
6.0000 | MEDICATED_PAD | Freq: Once | CUTANEOUS | Status: AC
  Administered 2018-11-27: 6 via TOPICAL

## 2018-11-26 MED ORDER — SODIUM CHLORIDE 0.9 % IV SOLN: INTRAVENOUS | Status: DC

## 2018-11-26 MED ORDER — HYDROCODONE-ACETAMINOPHEN 7.5-325 MG PO TABS: 1.0000 | ORAL_TABLET | Freq: Once | ORAL | Status: DC | PRN

## 2018-11-26 MED ORDER — PROMETHAZINE HCL 25 MG/ML IJ SOLN: 6.2500 mg | INTRAMUSCULAR | Status: DC | PRN

## 2018-11-26 MED ORDER — ONDANSETRON HCL 4 MG/2ML IJ SOLN
INTRAMUSCULAR | Status: DC | PRN
  Administered 2018-11-26: 4 mg via INTRAVENOUS

## 2018-11-26 MED ORDER — HYDROMORPHONE HCL 1 MG/ML IJ SOLN: 0.2500 mg | INTRAMUSCULAR | Status: DC | PRN

## 2018-11-26 MED ORDER — NALOXONE HCL 0.4 MG/ML IJ SOLN
INTRAMUSCULAR | Status: DC | PRN
  Administered 2018-11-26 (×2): 40 ug via INTRAVENOUS

## 2018-11-26 MED ORDER — ONDANSETRON HCL 4 MG/2ML IJ SOLN
INTRAMUSCULAR | Status: AC
  Filled 2018-11-26: qty 2

## 2018-11-26 MED ORDER — SUCCINYLCHOLINE CHLORIDE 20 MG/ML IJ SOLN
INTRAMUSCULAR | Status: DC | PRN
  Administered 2018-11-26: 80 mg via INTRAVENOUS

## 2018-11-26 MED ORDER — GLUCAGON HCL RDNA (DIAGNOSTIC) 1 MG IJ SOLR
INTRAMUSCULAR | Status: AC
  Filled 2018-11-26: qty 1

## 2018-11-26 MED ORDER — SUCCINYLCHOLINE CHLORIDE 200 MG/10ML IV SOSY
PREFILLED_SYRINGE | INTRAVENOUS | Status: AC
  Filled 2018-11-26: qty 10

## 2018-11-26 MED ORDER — FENTANYL CITRATE (PF) 100 MCG/2ML IJ SOLN
INTRAMUSCULAR | Status: DC | PRN
  Administered 2018-11-26: 25 ug via INTRAVENOUS

## 2018-11-26 MED ORDER — INDOMETHACIN 50 MG RE SUPP
100.0000 mg | Freq: Once | RECTAL | Status: AC
  Administered 2018-11-26: 100 mg via RECTAL
  Filled 2018-11-26: qty 2

## 2018-11-26 MED ORDER — LACTATED RINGERS IV SOLN: INTRAVENOUS | Status: DC

## 2018-11-26 MED ORDER — SUGAMMADEX SODIUM 500 MG/5ML IV SOLN
INTRAVENOUS | Status: DC | PRN
  Administered 2018-11-26: 200 mg via INTRAVENOUS

## 2018-11-26 MED ORDER — WATER FOR IRRIGATION, STERILE IR SOLN
Status: DC | PRN
  Administered 2018-11-26: 1000 mL

## 2018-11-26 MED ORDER — LACTATED RINGERS IV SOLN
INTRAVENOUS | Status: DC
  Administered 2018-11-26: 12:00:00 via INTRAVENOUS

## 2018-11-26 MED ORDER — CHLORHEXIDINE GLUCONATE CLOTH 2 % EX PADS
6.0000 | MEDICATED_PAD | Freq: Once | CUTANEOUS | Status: AC
  Administered 2018-11-26: 6 via TOPICAL

## 2018-11-26 MED ORDER — MEPERIDINE HCL 50 MG/ML IJ SOLN: 6.2500 mg | INTRAMUSCULAR | Status: DC | PRN

## 2018-11-26 MED ORDER — LACTATED RINGERS IV SOLN
INTRAVENOUS | Status: DC | PRN
  Administered 2018-11-26: 12:00:00 via INTRAVENOUS

## 2018-11-26 MED ORDER — ROCURONIUM BROMIDE 100 MG/10ML IV SOLN
INTRAVENOUS | Status: DC | PRN
  Administered 2018-11-26 (×2): 10 mg via INTRAVENOUS
  Administered 2018-11-26: 20 mg via INTRAVENOUS

## 2018-11-26 MED ORDER — GLYCOPYRROLATE 0.2 MG/ML IJ SOLN
INTRAMUSCULAR | Status: DC | PRN
  Administered 2018-11-26: 0.2 mg via INTRAVENOUS

## 2018-11-26 NOTE — Progress Notes (Signed)
Patients oxygen level on room air was 88-89. Placed patient on 2 liters of oxygen nasal canula. Patient oxygen sats 94. Will continue to monitor throughout shift.

## 2018-11-26 NOTE — Op Note (Signed)
[facility] Patient Name: Sherry Bridges Procedure Date: 11/26/2018 10:55 AM MRN: [id number] Date of Birth: [birth year] Attending MD: Lionel December , MD CSN: [id number] Age: 78 Admit Type: Inpatient Procedure:                ERCP Indications:              Bile duct stone(s) Providers:                Lionel December, MD, Dayton Scrape RN, RN,                            Judee Clara, RN, Burke Keels, Technician Referring MD:             Lia Hopping , MD Medicines:                General Anesthesia Complications:            No immediate complications. Estimated Blood Loss:     Estimated blood loss: none. Procedure:                Pre-Anesthesia Assessment:                           - Prior to the procedure, a History and Physical                            was performed, and patient medications and                            allergies were reviewed. The patient's tolerance of                            previous anesthesia was also reviewed. The risks                            and benefits of the procedure and the sedation                            options and risks were discussed with the patient.                            All questions were answered, and informed consent                            was obtained. Prior Anticoagulants: The patient has                            taken no previous anticoagulant or antiplatelet                            agents. ASA Grade Assessment: II - A patient with                            mild systemic disease. After reviewing the risks  and benefits, the patient was deemed in                            satisfactory condition to undergo the procedure.                           After obtaining informed consent, the scope was                            passed under direct vision. Throughout the                            procedure, the patient's blood pressure, pulse, and                            oxygen  saturations were monitored continuously. The                            [id number]-[id number] ([id number] scope was introduced through                            the mouth, and used to inject contrast into and                            used to inject contrast into the bile duct. The                            ERCP was technically difficult. Only pancreatic                            duct was cannulatted. 4 Fr. 5 cm long single                            pigtail PD stent was place. Bile duct could not be                            cannulatted.The patient tolerated the procedure                            well. UGI tract was also examined with Q-scope. Scope In: Scope Out: Findings:      The scout film was normal. The esophagus was successfully intubated       under direct vision. The scope was advanced to a normal major papilla in       the descending duodenum without detailed examination of the pharynx,       larynx and associated structures, and upper GI tract. The upper GI tract       was grossly normal. The bile duct could not be cannulated with Rx-44 and       RX-39 short-nosed traction sphincterotome. One 4 Fr by 5 cm plastic       stent with a single external pigtail and a single internal flap was       placed 4 cm into the ventral pancreatic duct. Clear fluid flowed through  the stent. The stent was in good position. Impression:               - Unsuccessful CBD cannulation because of small                            ampullary orifice and difficult approach.                           - One plastic stent was placed into the main                            pancreatic duct for prophylactic purposes. Moderate Sedation:      Per Anesthesia Care Recommendation:           - Clear liquid diet today.                           - Return patient to hospital ward for ongoing care.                           - Continue present medications.                           - Can proceed with cholecystectomy as  patient does                            not have CBD obstruction.                           - Repeat ERCP at a later date. Procedure Code(s):        --- Professional ---                           [phone], Endoscopic retrograde                            cholangiopancreatography (ERCP); with placement of                            endoscopic stent into biliary or pancreatic duct,                            including pre- and post-dilation and guide wire                            passage, when performed, including sphincterotomy,                            when performed, each stent Diagnosis Code(s):        --- Professional ---                           K80.50, Calculus of bile duct without cholangitis                            or cholecystitis without obstruction CPT  copyright 2019 [employer] Association. All rights reserved. The codes documented in this report are preliminary and upon coder review may  be revised to meet current compliance requirements. Lionel December, MD Lionel December, MD 11/26/2018 2:51:22 PM This report has been signed electronically. Number of Addenda: 0

## 2018-11-26 NOTE — Consult Note (Signed)
Reason for Consult: Referring Physician:  PCP Dr. Sherrie Sport. Sherry Bridges is an 78 y.o. female.  HPI: Patient was accepted as a transfer from [facility] to [facility] for cholecystitis. Had been having abdominal and vomiting x 1 day. She underwent a CT scan at [facility] which revealed finding suspicious for acute calculus cholecystitis along with a distal common bile duct stone. This morning she feels a little better. She is still has pain, especially on movement. No nausea or vomiting now. States has not had pain like this before. She was seen back in 2018. [place] in 2018 revealed milded dilated CBD with intraluminal stones or sludge. Normal appearance of the GB, liver , pancreas, and spleen.  MRI in 2018 revealed IMPRESSION: 1. No acute findings in the abdomen to account for the patient's symptoms. 2. Common bile duct is mildly dilated, but this is within normal limits for the patient's age, and there is no evidence of stricture or choledocholithiasis and no associated intrahepatic biliary ductal dilatation. She took a Baby ASA Monday. She is not on ASA regularly.      She is NPO.     CBC    Component Value Date/Time   WBC 19.8 (H) 11/26/2018 0437   RBC 4.63 11/26/2018 0437   HGB 12.6 11/26/2018 0437   HCT 40.6 11/26/2018 0437   PLT 331 11/26/2018 0437   MCV 87.7 11/26/2018 0437   MCH 27.2 11/26/2018 0437   MCHC 31.0 11/26/2018 0437   RDW 14.2 11/26/2018 0437   LYMPHSABS 1.8 11/26/2018 0437   MONOABS 2.0 (H) 11/26/2018 0437   EOSABS 0.0 11/26/2018 0437   BASOSABS 0.1 11/26/2018 0437   Hepatic Function Latest Ref Rng & Units 11/26/2018 01/02/2017  Total Protein 6.5 - 8.1 g/dL 5.7(L) 6.7  Albumin 3.5 - 5.0 g/dL 2.7(L) 4.1  AST 15 - 41 U/L 17 16  ALT 0 - 44 U/L 13 11  Alk Phosphatase 38 - 126 U/L 46 69  Total Bilirubin 0.3 - 1.2 mg/dL 0.7 0.5  Bilirubin, Direct <=0.2 mg/dL - 0.1     Past Medical History:  Diagnosis Date  . Hypothyroidism 01/02/2017    Past Surgical History:  Procedure  Laterality Date  . TUBAL LIGATION      History reviewed. No pertinent family history.  Social History:  reports that she has never smoked. She has never used smokeless tobacco. She reports current alcohol use. She reports that she does not use drugs.  Allergies: No Known Allergies  Medications: I have reviewed the patient's current medications.  Results for orders placed or performed during the hospital encounter of 11/25/18 (from the past 48 hour(s))  SARS Coronavirus 2 (CEPHEID - Performed in [facility] hospital lab), Hosp Order     Status: None   Collection Time: 11/25/18  7:26 PM  Result Value Ref Range   SARS Coronavirus 2 NEGATIVE NEGATIVE    Comment: (NOTE) If result is NEGATIVE SARS-CoV-2 target nucleic acids are NOT DETECTED. The SARS-CoV-2 RNA is generally detectable in upper and lower  respiratory specimens during the acute phase of infection. The lowest  concentration of SARS-CoV-2 viral copies this assay can detect is 250  copies / mL. A negative result does not preclude SARS-CoV-2 infection  and should not be used as the sole basis for treatment or other  patient management decisions.  A negative result may occur with  improper specimen collection / handling, submission of specimen other  than nasopharyngeal swab, presence of viral mutation(s) within the  areas  targeted by this assay, and inadequate number of viral copies  (<250 copies / mL). A negative result must be combined with clinical  observations, patient history, and epidemiological information. If result is POSITIVE SARS-CoV-2 target nucleic acids are DETECTED. The SARS-CoV-2 RNA is generally detectable in upper and lower  respiratory specimens dur ing the acute phase of infection.  Positive  results are indicative of active infection with SARS-CoV-2.  Clinical  correlation with patient history and other diagnostic information is  necessary to determine patient infection status.  Positive results do   not rule out bacterial infection or co-infection with other viruses. If result is PRESUMPTIVE POSTIVE SARS-CoV-2 nucleic acids MAY BE PRESENT.   A presumptive positive result was obtained on the submitted specimen  and confirmed on repeat testing.  While 2019 novel coronavirus  (SARS-CoV-2) nucleic acids may be present in the submitted sample  additional confirmatory testing may be necessary for epidemiological  and / or clinical management purposes  to differentiate between  SARS-CoV-2 and other Sarbecovirus currently known to infect humans.  If clinically indicated additional testing with an alternate test  methodology [phone]) is advised. The SARS-CoV-2 RNA is generally  detectable in upper and lower respiratory sp ecimens during the acute  phase of infection. The expected result is Negative. Fact Sheet for Patients:  StrictlyIdeas.no Fact Sheet for Healthcare Providers: BankingDealers.co.za This test is not yet approved or cleared by the [place] FDA and has been authorized for detection and/or diagnosis of SARS-CoV-2 by FDA under an Emergency Use Authorization (EUA).  This EUA will remain in effect (meaning this test can be used) for the duration of the COVID-19 declaration under Section 564(b)(1) of the Act, 21 U.S.C. section 360bbb-3(b)(1), unless the authorization is terminated or revoked sooner. Performed at [facility], [address] [place], [place] [address]   Comprehensive metabolic panel     Status: Abnormal   Collection Time: 11/26/18  4:37 AM  Result Value Ref Range   Sodium 136 135 - 145 mmol/L   Potassium 4.2 3.5 - 5.1 mmol/L   Chloride 105 98 - 111 mmol/L   CO2 24 22 - 32 mmol/L   Glucose, Bld 92 70 - 99 mg/dL   BUN 12 8 - 23 mg/dL   Creatinine, Ser 0.75 0.44 - 1.00 mg/dL   Calcium 8.3 (L) 8.9 - 10.3 mg/dL   Total Protein 5.7 (L) 6.5 - 8.1 g/dL   Albumin 2.7 (L) 3.5 - 5.0 g/dL   AST 17 15 - 41 U/L    ALT 13 0 - 44 U/L   Alkaline Phosphatase 46 38 - 126 U/L   Total Bilirubin 0.7 0.3 - 1.2 mg/dL   GFR calc non Af Amer >60 >60 mL/min   GFR calc Af Amer >60 >60 mL/min   Anion gap 7 5 - 15    Comment: Performed at [facility], [address], [place], [place] [address]  Protime-INR     Status: Abnormal   Collection Time: 11/26/18  4:37 AM  Result Value Ref Range   Prothrombin Time 15.9 (H) 11.4 - 15.2 seconds   INR 1.3 (H) 0.8 - 1.2    Comment: (NOTE) INR goal varies based on device and disease states. Performed at [facility], [address] [place],  [address]   CBC with Differential     Status: Abnormal   Collection Time: 11/26/18  4:37 AM  Result Value Ref Range   WBC 19.8 (H) 4.0 - 10.5 K/uL  RBC 4.63 3.87 - 5.11 MIL/uL   Hemoglobin 12.6 12.0 - 15.0 g/dL   HCT 40.6 36.0 - 46.0 %   MCV 87.7 80.0 - 100.0 fL   MCH 27.2 26.0 - 34.0 pg   MCHC 31.0 30.0 - 36.0 g/dL   RDW 14.2 11.5 - 15.5 %   Platelets 331 150 - 400 K/uL   nRBC 0.0 0.0 - 0.2 %   Neutrophils Relative % 80 %   Neutro Abs 15.7 (H) 1.7 - 7.7 K/uL   Lymphocytes Relative 9 %   Lymphs Abs 1.8 0.7 - 4.0 K/uL   Monocytes Relative 10 %   Monocytes Absolute 2.0 (H) 0.1 - 1.0 K/uL   Eosinophils Relative 0 %   Eosinophils Absolute 0.0 0.0 - 0.5 K/uL   Basophils Relative 0 %   Basophils Absolute 0.1 0.0 - 0.1 K/uL   Immature Granulocytes 1 %   Abs Immature Granulocytes 0.18 (H) 0.00 - 0.07 K/uL    Comment: Performed at [facility], [address] [place], [place] [address]    No results found.  ROS Blood pressure (!) 111/51, pulse 83, temperature (!) 100.4 F (38 C), temperature source Oral, resp. rate 16, height '5\' 4"'$  (1.626 m), weight 68.4 kg, SpO2 94 %. Physical Exam Alert and oriented. Skin warm and dry. Oral mucosa is moist.   . Sclera anicteric, conjunctivae is pink. Thyroid not enlarged. No cervical lymphadenopathy. Lungs clear. Heart regular rate and rhythm.  Abdomen is soft. Bowel sounds are  positive. No hepatomegaly. No abdominal masses felt. RUQ tenderness.  No edema to lower extremities.      Assessment/Plan: Cholecystitis with CBD stone. Dr. Laural Golden is aware. Possible ERCP today.   Graysen Woodyard L Bobbette Eakes 11/26/2018, 8:19 AM

## 2018-11-26 NOTE — Anesthesia Postprocedure Evaluation (Signed)
Anesthesia Post Note  Patient: Sherry Bridges  Procedure(s) Performed: UNSUCCESSFUL ENDOSCOPIC RETROGRADE CHOLANGIOPANCREATOGRAPHY (ERCP) BECAUSE OF SMALL ORIFICE AND EDEMA (N/A ) BILIARY STENT PLACEMENT (PANCREATIC)  Patient location during evaluation: PACU Anesthesia Type: General Level of consciousness: awake and alert and oriented Pain management: pain level controlled Vital Signs Assessment: post-procedure vital signs reviewed and stable Respiratory status: spontaneous breathing (At baseline) Cardiovascular status: stable Postop Assessment: no apparent nausea or vomiting Anesthetic complications: no     Last Vitals:  Vitals:   11/26/18 1415 11/26/18 1430  BP: (!) 127/49 (!) 101/50  Pulse: (!) 104 88  Resp: (!) 22 (!) 23  Temp: 37.9 C   SpO2: 96% 92%    Last Pain:  Vitals:   11/26/18 1415  TempSrc:   PainSc: 0-No pain                 [name] A

## 2018-11-26 NOTE — Progress Notes (Signed)
Called patients daughter to inform patients daughter that the patient will have ERCP done today by GI. Will let day shift nurse know to update patients daughter.

## 2018-11-26 NOTE — Transfer of Care (Signed)
Immediate Anesthesia Transfer of Care Note  Patient: Sherry Bridges  Procedure(s) Performed: UNSUCCESSFUL ENDOSCOPIC RETROGRADE CHOLANGIOPANCREATOGRAPHY (ERCP) BECAUSE OF SMALL ORIFICE AND EDEMA (N/A ) BILIARY STENT PLACEMENT (PANCREATIC)  Patient Location: PACU  Anesthesia Type:General  Level of Consciousness: awake and patient cooperative  Airway & Oxygen Therapy: Patient Spontanous Breathing and non-rebreather face mask  Post-op Assessment: Report given to RN and Post -op Vital signs reviewed and stable  Post vital signs: Reviewed and stable  Last Vitals:  Vitals Value Taken Time  BP 127/49 11/26/2018  2:13 PM  Temp    Pulse 104 11/26/2018  2:15 PM  Resp 22 11/26/2018  2:15 PM  SpO2 86 % 11/26/2018  2:15 PM  Vitals shown include unvalidated device data.  Last Pain:  Vitals:   11/26/18 1130  TempSrc:   PainSc: 5       Patients Stated Pain Goal: 3 (11/26/18 1042)  Complications: No apparent anesthesia complications

## 2018-11-26 NOTE — Anesthesia Procedure Notes (Signed)
Procedure Name: Intubation Date/Time: 11/26/2018 11:48 AM Performed by: Franco Nones, CRNA Pre-anesthesia Checklist: Patient identified, Patient being monitored, Timeout performed, Emergency Drugs available and Suction available Patient Re-evaluated:Patient Re-evaluated prior to induction Oxygen Delivery Method: Circle System Utilized Preoxygenation: Pre-oxygenation with 100% oxygen Induction Type: IV induction, Rapid sequence and Cricoid Pressure applied Laryngoscope Size: Miller and 2 Grade View: Grade I Tube type: Oral Tube size: 7.0 mm Number of attempts: 1 Airway Equipment and Method: Stylet Placement Confirmation: ETT inserted through vocal cords under direct vision,  positive ETCO2 and breath sounds checked- equal and bilateral Secured at: 21 cm Tube secured with: Tape Dental Injury: Teeth and Oropharynx as per pre-operative assessment

## 2018-11-26 NOTE — H&P (View-Only) (Signed)
[facility] Consult  Reason for Consult: Acute cholecystitis, choledocholithiasis  Referring Physician: Dr. Domenica Reamerehman     Sherry Bridges is a 78 y.o. female.  HPI: Sherry Bridges is a very healthy and active 78 yo who [facility] to [facility] with acute onset of abdominal pain with associated burping, nausea/vomiting for 1 day. The pain was severe in nature and located in her upper abdomen, right abdomen.  [facility] was worked [facility] and had an MRCP that demonstrated acute cholecystitis and findings of choledocholithiasis. Dr. Karilyn Cotaehman was notified and accepted the patient for transfer for ERCP this AM.  I was able to see the patient in preoperative area prior to her receiving any medications.    She is still having some pain but it is much better. Her nausea and vomiting have also been treated.  She says that she is mostly just tired.  She denied any fevers or chills.   Past Medical History:  Diagnosis Date  . Hypothyroidism 01/02/2017    Past Surgical History:  Procedure Laterality Date  . TUBAL LIGATION      History reviewed. No pertinent family history.  Social History   Tobacco Use  . Smoking status: Never Smoker  . Smokeless tobacco: Never Used  Substance Use Topics  . Alcohol use: Yes    Comment: OCCASIONAL  . Drug use: No    Medications:  I have reviewed the patient's current medications. Prior to Admission:  Medications Prior to Admission  Medication Sig Dispense Refill Last Dose  . atorvastatin (LIPITOR) 40 MG tablet Take 40 mg by mouth daily.   11/23/2018 at Unknown time  . co-enzyme Q-10 30 MG capsule Take 30 mg by mouth daily.    11/23/2018 at Unknown time  . EUTHYROX 88 MCG tablet Take 88 mcg by mouth daily.   11/23/2018 at Unknown time  . Multiple Vitamin (MULTIVITAMIN) tablet Take 1 tablet by mouth daily.   11/23/2018 at Unknown time  . Omega-3 Fatty Acids (FISH OIL) 1000 MG CAPS Take 1 capsule by mouth daily.    11/23/2018 at Unknown  time   Scheduled: . [MAR Hold] atorvastatin  40 mg Oral q1800  . glucagon (human recombinant)      . [MAR Hold] levothyroxine  88 mcg Oral Q0600   Continuous: . sodium chloride 125 mL/hr at 11/26/18 0542  . [MAR Hold] cefTRIAXone (ROCEPHIN)  IV 2 g (11/25/18 2040)  . sodium chloride     PRN:[MAR Hold]  morphine injection, [MAR Hold] ondansetron (ZOFRAN) IV  No Known Allergies   ROS:  A comprehensive review of systems was negative except for: Gastrointestinal: positive for abdominal pain, nausea and vomiting  Blood pressure (!) 115/54, pulse 83, temperature 99 F (37.2 C), resp. rate (!) 29, height 5\' 4"  (1.626 m), weight 68.4 kg, SpO2 95 %. Physical Exam  Results: Results for orders placed or performed during the hospital encounter of 11/25/18 (from the past 48 hour(s))  SARS Coronavirus 2 (CEPHEID - Performed in [facility] hospital lab), Hosp Order     Status: None   Collection Time: 11/25/18  7:26 PM  Result Value Ref Range   SARS Coronavirus 2 NEGATIVE NEGATIVE    Comment: (NOTE) If result is NEGATIVE SARS-CoV-2 target nucleic acids are NOT DETECTED. The SARS-CoV-2 RNA is generally detectable in upper and lower  respiratory specimens during the acute phase of infection. The lowest  concentration of SARS-CoV-2 viral copies this assay can detect is 250  copies / mL.  A negative result does not preclude SARS-CoV-2 infection  and should not be used as the sole basis for treatment or other  patient management decisions.  A negative result may occur with  improper specimen collection / handling, submission of specimen other  than nasopharyngeal swab, presence of viral mutation(s) within the  areas targeted by this assay, and inadequate number of viral copies  (<250 copies / mL). A negative result must be combined with clinical  observations, patient history, and epidemiological information. If result is POSITIVE SARS-CoV-2 target nucleic acids are DETECTED. The SARS-CoV-2  RNA is generally detectable in upper and lower  respiratory specimens dur ing the acute phase of infection.  Positive  results are indicative of active infection with SARS-CoV-2.  Clinical  correlation with patient history and other diagnostic information is  necessary to determine patient infection status.  Positive results do  not rule out bacterial infection or co-infection with other viruses. If result is PRESUMPTIVE POSTIVE SARS-CoV-2 nucleic acids MAY BE PRESENT.   A presumptive positive result was obtained on the submitted specimen  and confirmed on repeat testing.  While 2019 novel coronavirus  (SARS-CoV-2) nucleic acids may be present in the submitted sample  additional confirmatory testing may be necessary for epidemiological  and / or clinical management purposes  to differentiate between  SARS-CoV-2 and other Sarbecovirus currently known to infect humans.  If clinically indicated additional testing with an alternate test  methodology [phone]) is advised. The SARS-CoV-2 RNA is generally  detectable in upper and lower respiratory sp ecimens during the acute  phase of infection. The expected result is Negative. Fact Sheet for Patients:  BoilerBrush.com.cy Fact Sheet for Healthcare Providers: https://pope.com/ This test is not yet approved or cleared by the [place] FDA and has been authorized for detection and/or diagnosis of SARS-CoV-2 by FDA under an Emergency Use Authorization (EUA).  This EUA will remain in effect (meaning this test can be used) for the duration of the COVID-19 declaration under Section 564(b)(1) of the Act, 21 U.S.C. section 360bbb-3(b)(1), unless the authorization is terminated or revoked sooner. Performed at [facility], [address], [place], [place] [address]   Comprehensive metabolic panel     Status: Abnormal   Collection Time: 11/26/18  4:37 AM  Result Value Ref Range   Sodium 136 135 -  145 mmol/L   Potassium 4.2 3.5 - 5.1 mmol/L   Chloride 105 98 - 111 mmol/L   CO2 24 22 - 32 mmol/L   Glucose, Bld 92 70 - 99 mg/dL   BUN 12 8 - 23 mg/dL   Creatinine, Ser 0.38 0.44 - 1.00 mg/dL   Calcium 8.3 (L) 8.9 - 10.3 mg/dL   Total Protein 5.7 (L) 6.5 - 8.1 g/dL   Albumin 2.7 (L) 3.5 - 5.0 g/dL   AST 17 15 - 41 U/L   ALT 13 0 - 44 U/L   Alkaline Phosphatase 46 38 - 126 U/L   Total Bilirubin 0.7 0.3 - 1.2 mg/dL   GFR calc non Af Amer >60 >60 mL/min   GFR calc Af Amer >60 >60 mL/min   Anion gap 7 5 - 15    Comment: Performed at [facility], [address] [place], [place] [address]  Protime-INR     Status: Abnormal   Collection Time: 11/26/18  4:37 AM  Result Value Ref Range   Prothrombin Time 15.9 (H) 11.4 - 15.2 seconds   INR 1.3 (H) 0.8 - 1.2    Comment: (NOTE)  INR goal varies based on device and disease states. Performed at [facility], [address], [place], [place] [address]   CBC with Differential     Status: Abnormal   Collection Time: 11/26/18  4:37 AM  Result Value Ref Range   WBC 19.8 (H) 4.0 - 10.5 K/uL   RBC 4.63 3.87 - 5.11 MIL/uL   Hemoglobin 12.6 12.0 - 15.0 g/dL   HCT 60.4 54.0 - 98.1 %   MCV 87.7 80.0 - 100.0 fL   MCH 27.2 26.0 - 34.0 pg   MCHC 31.0 30.0 - 36.0 g/dL   RDW 19.1 47.8 - 29.5 %   Platelets 331 150 - 400 K/uL   nRBC 0.0 0.0 - 0.2 %   Neutrophils Relative % 80 %   Neutro Abs 15.7 (H) 1.7 - 7.7 K/uL   Lymphocytes Relative 9 %   Lymphs Abs 1.8 0.7 - 4.0 K/uL   Monocytes Relative 10 %   Monocytes Absolute 2.0 (H) 0.1 - 1.0 K/uL   Eosinophils Relative 0 %   Eosinophils Absolute 0.0 0.0 - 0.5 K/uL   Basophils Relative 0 %   Basophils Absolute 0.1 0.0 - 0.1 K/uL   Immature Granulocytes 1 %   Abs Immature Granulocytes 0.18 (H) 0.00 - 0.07 K/uL    Comment: Performed at [facility], [address] [place], [place] [address]    OSH MRCP Report reviewed -Dr. Karilyn Cota looked at images with Dr. Tyron Russell and old images, agrees with read of  stones in duct and cholecystitis        Assessment & Plan:  Sherry Bridges is a 78 y.o. female with acute cholecystitis and choledocholithiasis. She is more symptomatic from the cholecystitis based on her leukocytosis, but definitely needs to get her CBD stones removed. Dr. Karilyn Cota is doing that this morning. The patient is otherwise doing well and is a good candidate for cholecystectomy.   -PLAN: I counseled the patient about the indication, risks and benefits of laparoscopic cholecystectomy.  She understands there is a very small chance for bleeding, infection, injury to normal structures (including common bile duct), conversion to open surgery, persistent symptoms, evolution of postcholecystectomy diarrhea, need for secondary interventions, anesthesia reaction, cardiopulmonary issues and other risks not specifically detailed here. I described the expected recovery, the plan for follow-up and the restrictions during the recovery phase.  All questions were answered.  -OR for Lap cholecystectomy tomorrow AM  -IV antibiotics for now, Rocephin is ordered -COVID negative  -Spoke with her daughter, Kathreen Devoid.  -NPO at midnight  All questions were answered to the satisfaction of the patient and family.  Sherry Bridges 11/26/2018, 12:12 PM

## 2018-11-26 NOTE — Progress Notes (Signed)
ERCP note:  Difficult exam because of redundant edematous duodenal mucosal folds and tiny  hidden ampulla of Vater. Upper GI tract was also surveyed with Q-scope and no abnormality noted. Bile duct could not be cannulated. Pancreatic duct cannulated with 035 guidewire and single pigtail 4 French 5 cm stent placed for prophylaxis.  Since patient does not have biliary obstruction could proceed with cholecystectomy with repeat ERCP at a later date.

## 2018-11-26 NOTE — Progress Notes (Signed)
PROGRESS NOTE    Sherry Bridges  ZVJ:[id number] DOB: [birth year] DOA: 11/25/2018 PCP: Toma Deiters, MD   Brief Narrative:  HPI per Dr. Leandro Reasoner on 11/25/2018  Sherry Bridges is an 78 y.o. female with past medical history significant for hypertension and hyperlipidemia who was in her usual state of health until yesterday when she started having nausea and then vomiting.  Patient noted that the vomiting increased and then she developed right upper quadrant pain which became increasingly severe.  Patient was seen at [facility] rocking him and was admitted for concerns for cholecystitis.  Patient was treated symptomatically with Zofran, Rocephin and IV fluids.  ERCP which showed 3 common bile duct stones the largest being 5 mm with common bile duct dilatation.  Dilatation of the pancreatic duct.  Patient was discussed with Dr.Rehman who accepted him for transfer to [facility] for ERCP in the morning.  Patient states she is doing better with no vomiting since yesterday, improved nausea and reasonable pain management.  Patient denies fevers chills diarrhea or feeling unwell until yesterday when the vomiting and right upper quadrant pain started.  **Interim History Gastroenterology evaluated and patient was taken for ERCP this morning.  General surgery was also consulted for cholecystectomy.  Patient was rehydrated and will continue supportive care and diet advancement per General Surgery and Gastroenterology.  Assessment & Plan:   Active Problems:   Acute cholecystitis   Essential hypertension   Hyperlipidemia   Nausea and vomiting   Acute cholecystitis due to biliary calculus  Acute Cholecystitis due to Biliary Calculus complicated by choledocholithiasis with concern for ascending cholangitis -Patient was transferred from [facility] and admitted with concern for cholecystitis in her nausea, vomiting, right upper quadrant pain -Patient was febrile 100.4 on admission -She was  symptomatically treated with IV Zofran, IV ceftriaxone and IV fluid hydration at a rage of 125 mL/hr and changed to 50 mL/hr of LR -MRCP showed 3 common bile duct stones with the largest being 5 mm with common bile duct dilatation as well as dilatation of the pancreatic duct -SARS-CoV-2 Testing was negative  -WBC on Admission was 19.8 -Gastroenterology consulted for further evaluation recommendations and patient is to undergo ERCP this morning -Will consult General Surgery for further evaluation and recommendations and timing of Cholecystectomy; Dr. Henreitta Leber notified by Dr. Karilyn Cota -Currently she is n.p.o. was on clear liquid diet prior to this -Diet advancement per GI and general surgery -Continue with pain control with IV morphine 1 mg every 3 as needed severe pain -LFTs and T bili were normal but will add on a lipase level  Nausea and Vomiting -C/w Supportive Care and with Ondansetron 4 mg IV q6hprn N/V -C/w IVF Hydration as above  Hypothyroidism -TSH in the morning -Continue levothyroxine 88 mcg p.o. daily when able to take p.o.  Hyperlipidemia -Continue with Atorvastatin 40 mg p.o. daily  DVT prophylaxis: SCDs Code Status: FULL CODE Family Communication: No family present at bedside  Disposition Plan: Remain Inpatient for continued Treatment and evaluation by General Surgery for Cholecystectomy   Consultants:   Gastroenterology Dr. Karilyn Cota  General Surgery Dr. Henreitta Leber    Procedures:  ERCP 11/26/2018    Antimicrobials:  Anti-infectives (From admission, onward)   Start     Dose/Rate Route Frequency Ordered Stop   11/25/18 2100  [MAR Hold]  cefTRIAXone (ROCEPHIN) 2 g in sodium chloride 0.9 % 100 mL IVPB     (MAR Hold since Wed 11/26/2018 at 1103. Reason: Transfer to a  Procedural area.)   2 g 200 mL/hr over 30 Minutes Intravenous Every 24 hours 11/25/18 2012       Subjective: Patient was seen and examined at bedside and she is still complaining of some abdominal pain.  Denies  chest pain, lightheadedness or dizziness.  States that she is no longer nauseous or vomiting now.  Has not had anything to eat since Monday.  No other concerns or complaints at this time.  Objective: Vitals:   11/25/18 1951 11/25/18 2213 11/26/18 0517 11/26/18 0546  BP: (!) 121/54 107/63 (!) 114/44 (!) 111/51  Pulse: 81 81 84 83  Resp: Temp: 99.2 F (37.3 C) 99.2 F (37.3 C) (!) 100.4 F (38 C)   TempSrc: Oral Oral Oral   SpO2: 94% 92% 90% 94%  Weight: 68.4 kg     Height:  (1.626 m)       Intake/Output Summary (Last 24 hours) at 11/26/2018 0746 Last data filed at 11/26/2018 1610 Gross per 24 hour  Intake 832.12 ml  Output 200 ml  Net 632.12 ml   Filed Weights   11/25/18 1951  Weight: 68.4 kg   Examination: Physical Exam:  Constitutional: WN/WD overweight Caucasian female in NAD and appears calm but somewhat uncomfortable Eyes: Lids and conjunctivae normal, sclerae anicteric  ENMT: External Ears, Nose appear normal. Grossly normal hearing.  Neck: Appears normal, supple, no cervical masses, normal ROM, no appreciable thyromegaly; no JVD Respiratory: Diminished to auscultation bilaterally, no wheezing, rales, rhonchi or crackles. Normal respiratory effort and patient is not tachypenic. No accessory muscle use.  Cardiovascular: RRR, no murmurs / rubs / gallops. S1 and S2 auscultated. No appreciable extremity edema. Abdomen: Soft, tender, mildly distended. No masses palpated. No appreciable hepatosplenomegaly. Bowel sounds positive x4.  GU: Deferred. Musculoskeletal: No clubbing / cyanosis of digits/nails. No joint deformity upper and lower extremities.  Skin: No rashes, lesions, ulcers on a limited skin evaluation. No induration; Warm and dry.  Neurologic: CN 2-12 grossly intact with no focal deficits. Romberg sign and cerebellar reflexes not assessed.  Psychiatric: Normal judgment and insight. Alert and oriented x 3. Mildly anxious mood and appropriate affect.    Data Reviewed: I have personally reviewed following labs and imaging studies  CBC: Recent Labs  Lab 11/26/18 0437  WBC 19.8*  NEUTROABS 15.7*  HGB 12.6  HCT 40.6  MCV 87.7  PLT 331   Basic Metabolic Panel: Recent Labs  Lab 11/26/18 0437  NA 136  K 4.2  CL 105  CO2 24  GLUCOSE 92  BUN 12  CREATININE 0.75  CALCIUM 8.3*   GFR: Estimated Creatinine Clearance: 56 mL/min (by C-G formula based on SCr of 0.75 mg/dL). Liver Function Tests: Recent Labs  Lab 11/26/18 0437  AST 17  ALT 13  ALKPHOS 46  BILITOT 0.7  PROT 5.7*  ALBUMIN 2.7*   No results for input(s): LIPASE, AMYLASE in the last 168 hours. No results for input(s): AMMONIA in the last 168 hours. Coagulation Profile: Recent Labs  Lab 11/26/18 0437  INR 1.3*   Cardiac Enzymes: No results for input(s): CKTOTAL, CKMB, CKMBINDEX, TROPONINI in the last 168 hours. BNP (last 3 results) No results for input(s): PROBNP in the last 8760 hours. HbA1C: No results for input(s): HGBA1C in the last 72 hours. CBG: No results for input(s): GLUCAP in the last 168 hours. Lipid Profile: No results for input(s): CHOL, HDL, LDLCALC, TRIG, CHOLHDL, LDLDIRECT in the last 72 hours. Thyroid Function Tests: No  results for input(s): TSH, T4TOTAL, FREET4, T3FREE, THYROIDAB in the last 72 hours. Anemia Panel: No results for input(s): VITAMINB12, FOLATE, FERRITIN, TIBC, IRON, RETICCTPCT in the last 72 hours. Sepsis Labs: No results for input(s): PROCALCITON, LATICACIDVEN in the last 168 hours.  Recent Results (from the past 240 hour(s))  SARS Coronavirus 2 (CEPHEID - Performed in [facility] hospital lab), Hosp Order     Status: None   Collection Time: 11/25/18  7:26 PM  Result Value Ref Range Status   SARS Coronavirus 2 NEGATIVE NEGATIVE Final    Comment: (NOTE) If result is NEGATIVE SARS-CoV-2 target nucleic acids are NOT DETECTED. The SARS-CoV-2 RNA is generally detectable in upper and lower  respiratory specimens during  the acute phase of infection. The lowest  concentration of SARS-CoV-2 viral copies this assay can detect is 250  copies / mL. A negative result does not preclude SARS-CoV-2 infection  and should not be used as the sole basis for treatment or other  patient management decisions.  A negative result may occur with  improper specimen collection / handling, submission of specimen other  than nasopharyngeal swab, presence of viral mutation(s) within the  areas targeted by this assay, and inadequate number of viral copies  (<250 copies / mL). A negative result must be combined with clinical  observations, patient history, and epidemiological information. If result is POSITIVE SARS-CoV-2 target nucleic acids are DETECTED. The SARS-CoV-2 RNA is generally detectable in upper and lower  respiratory specimens dur ing the acute phase of infection.  Positive  results are indicative of active infection with SARS-CoV-2.  Clinical  correlation with patient history and other diagnostic information is  necessary to determine patient infection status.  Positive results do  not rule out bacterial infection or co-infection with other viruses. If result is PRESUMPTIVE POSTIVE SARS-CoV-2 nucleic acids MAY BE PRESENT.   A presumptive positive result was obtained on the submitted specimen  and confirmed on repeat testing.  While 2019 novel coronavirus  (SARS-CoV-2) nucleic acids may be present in the submitted sample  additional confirmatory testing may be necessary for epidemiological  and / or clinical management purposes  to differentiate between  SARS-CoV-2 and other Sarbecovirus currently known to infect humans.  If clinically indicated additional testing with an alternate test  methodology [phone]([id number]) is advised. The SARS-CoV-2 RNA is generally  detectable in upper and lower respiratory sp ecimens during the acute  phase of infection. The expected result is Negative. Fact Sheet for Patients:   BoilerBrush.com.cyhttps://www.fda.gov/media/136312/download Fact Sheet for Healthcare Providers: https://pope.com/https://www.fda.gov/media/136313/download This test is not yet approved or cleared by the [place] FDA and has been authorized for detection and/or diagnosis of SARS-CoV-2 by FDA under an Emergency Use Authorization (EUA).  This EUA will remain in effect (meaning this test can be used) for the duration of the COVID-19 declaration under Section 564(b)(1) of the Act, 21 U.S.C. section 360bbb-3(b)(1), unless the authorization is terminated or revoked sooner. Performed at [facility], [address] [place], [place] [id number]     Radiology Studies: No results found.  Scheduled Meds: . atorvastatin  40 mg Oral q1800  . levothyroxine  88 mcg Oral Q0600   Continuous Infusions: . sodium chloride 125 mL/hr at 11/26/18 0542  . cefTRIAXone (ROCEPHIN)  IV 2 g (11/25/18 2040)    LOS: 1 day   Merlene Laughtermair Latif Leida Luton, DO [facility] PAGER is on AMION  If 7PM-7AM, please contact night-coverage [email or web address] Password [email or web address] 11/26/2018, 7:46 AM

## 2018-11-26 NOTE — Anesthesia Preprocedure Evaluation (Signed)
Anesthesia Evaluation    Airway Mallampati: II       Dental  (+) Partial Upper, Lower Dentures   Pulmonary    breath sounds clear to auscultation       Cardiovascular hypertension,  Rhythm:regular     Neuro/Psych    GI/Hepatic   Endo/Other  Hypothyroidism   Renal/GU      Musculoskeletal   Abdominal   Peds  Hematology   Anesthesia Other Findings Choledocolithiasis, acute cholecystitis Denies any CV, pulm, endo, neuro ds  Reproductive/Obstetrics                             Anesthesia Physical Anesthesia Plan  ASA: II  Anesthesia Plan: General   Post-op Pain Management:    Induction:   PONV Risk Score and Plan:   Airway Management Planned:   Additional Equipment:   Intra-op Plan:   Post-operative Plan:   Informed Consent: I have reviewed the patients History and Physical, chart, labs and discussed the procedure including the risks, benefits and alternatives for the proposed anesthesia with the patient or authorized representative who has indicated his/her understanding and acceptance.       Plan Discussed with: Anesthesiologist  Anesthesia Plan Comments:         Anesthesia Quick Evaluation

## 2018-11-26 NOTE — Consult Note (Signed)
[facility] Consult  Reason for Consult: Acute cholecystitis, choledocholithiasis  Referring Physician: Dr. Domenica Reamerehman     Sherry Bridges is a 78 y.o. female.  HPI: Sherry Bridges is a very healthy and active 78 yo who presented to [facility] with acute onset of abdominal pain with associated burping, nausea/vomiting for 1 day. The pain was severe in nature and located in her upper abdomen, right abdomen.  She was worked up at [facility] and had an MRCP that demonstrated acute cholecystitis and findings of choledocholithiasis. Dr. Karilyn Cotaehman was notified and accepted the patient for transfer for ERCP this AM.  I was able to see the patient in preoperative area prior to her receiving any medications.    She is still having some pain but it is much better. Her nausea and vomiting have also been treated.  She says that she is mostly just tired.  She denied any fevers or chills.   Past Medical History:  Diagnosis Date  . Hypothyroidism 01/02/2017    Past Surgical History:  Procedure Laterality Date  . TUBAL LIGATION      History reviewed. No pertinent family history.  Social History   Tobacco Use  . Smoking status: Never Smoker  . Smokeless tobacco: Never Used  Substance Use Topics  . Alcohol use: Yes    Comment: OCCASIONAL  . Drug use: No    Medications:  I have reviewed the patient's current medications. Prior to Admission:  Medications Prior to Admission  Medication Sig Dispense Refill Last Dose  . atorvastatin (LIPITOR) 40 MG tablet Take 40 mg by mouth daily.   11/23/2018 at Unknown time  . co-enzyme Q-10 30 MG capsule Take 30 mg by mouth daily.    11/23/2018 at Unknown time  . EUTHYROX 88 MCG tablet Take 88 mcg by mouth daily.   11/23/2018 at Unknown time  . Multiple Vitamin (MULTIVITAMIN) tablet Take 1 tablet by mouth daily.   11/23/2018 at Unknown time  . Omega-3 Fatty Acids (FISH OIL) 1000 MG CAPS Take 1 capsule by mouth daily.    11/23/2018 at Unknown  time   Scheduled: . [MAR Hold] atorvastatin  40 mg Oral q1800  . glucagon (human recombinant)      . [MAR Hold] levothyroxine  88 mcg Oral Q0600   Continuous: . sodium chloride 125 mL/hr at 11/26/18 0542  . [MAR Hold] cefTRIAXone (ROCEPHIN)  IV 2 g (11/25/18 2040)  . sodium chloride     PRN:[MAR Hold]  morphine injection, [MAR Hold] ondansetron (ZOFRAN) IV  No Known Allergies   ROS:  A comprehensive review of systems was negative except for: Gastrointestinal: positive for abdominal pain, nausea and vomiting  Blood pressure (!) 115/54, pulse 83, temperature 99 F (37.2 C), resp. rate (!) 29, height 5\' 4"  (1.626 m), weight 68.4 kg, SpO2 95 %. Physical Exam  Results: Results for orders placed or performed during the hospital encounter of 11/25/18 (from the past 48 hour(s))  SARS Coronavirus 2 (CEPHEID - Performed in [facility] hospital lab), Hosp Order     Status: None   Collection Time: 11/25/18  7:26 PM  Result Value Ref Range   SARS Coronavirus 2 NEGATIVE NEGATIVE    Comment: (NOTE) If result is NEGATIVE SARS-CoV-2 target nucleic acids are NOT DETECTED. The SARS-CoV-2 RNA is generally detectable in upper and lower  respiratory specimens during the acute phase of infection. The lowest  concentration of SARS-CoV-2 viral copies this assay can detect is 250  copies / mL.  A negative result does not preclude SARS-CoV-2 infection  and should not be used as the sole basis for treatment or other  patient management decisions.  A negative result may occur with  improper specimen collection / handling, submission of specimen other  than nasopharyngeal swab, presence of viral mutation(s) within the  areas targeted by this assay, and inadequate number of viral copies  (<250 copies / mL). A negative result must be combined with clinical  observations, patient history, and epidemiological information. If result is POSITIVE SARS-CoV-2 target nucleic acids are DETECTED. The SARS-CoV-2  RNA is generally detectable in upper and lower  respiratory specimens dur ing the acute phase of infection.  Positive  results are indicative of active infection with SARS-CoV-2.  Clinical  correlation with patient history and other diagnostic information is  necessary to determine patient infection status.  Positive results do  not rule out bacterial infection or co-infection with other viruses. If result is PRESUMPTIVE POSTIVE SARS-CoV-2 nucleic acids MAY BE PRESENT.   A presumptive positive result was obtained on the submitted specimen  and confirmed on repeat testing.  While 2019 novel coronavirus  (SARS-CoV-2) nucleic acids may be present in the submitted sample  additional confirmatory testing may be necessary for epidemiological  and / or clinical management purposes  to differentiate between  SARS-CoV-2 and other Sarbecovirus currently known to infect humans.  If clinically indicated additional testing with an alternate test  methodology [phone]) is advised. The SARS-CoV-2 RNA is generally  detectable in upper and lower respiratory sp ecimens during the acute  phase of infection. The expected result is Negative. Fact Sheet for Patients:  BoilerBrush.com.cy Fact Sheet for Healthcare Providers: https://pope.com/ This test is not yet approved or cleared by the [place] FDA and has been authorized for detection and/or diagnosis of SARS-CoV-2 by FDA under an Emergency Use Authorization (EUA).  This EUA will remain in effect (meaning this test can be used) for the duration of the COVID-19 declaration under Section 564(b)(1) of the Act, 21 U.S.C. section 360bbb-3(b)(1), unless the authorization is terminated or revoked sooner. Performed at [facility], [address], [place], [place] [address]   Comprehensive metabolic panel     Status: Abnormal   Collection Time: 11/26/18  4:37 AM  Result Value Ref Range   Sodium 136 135 -  145 mmol/L   Potassium 4.2 3.5 - 5.1 mmol/L   Chloride 105 98 - 111 mmol/L   CO2 24 22 - 32 mmol/L   Glucose, Bld 92 70 - 99 mg/dL   BUN 12 8 - 23 mg/dL   Creatinine, Ser 0.38 0.44 - 1.00 mg/dL   Calcium 8.3 (L) 8.9 - 10.3 mg/dL   Total Protein 5.7 (L) 6.5 - 8.1 g/dL   Albumin 2.7 (L) 3.5 - 5.0 g/dL   AST 17 15 - 41 U/L   ALT 13 0 - 44 U/L   Alkaline Phosphatase 46 38 - 126 U/L   Total Bilirubin 0.7 0.3 - 1.2 mg/dL   GFR calc non Af Amer >60 >60 mL/min   GFR calc Af Amer >60 >60 mL/min   Anion gap 7 5 - 15    Comment: Performed at [facility], [address] [place], [place] [address]  Protime-INR     Status: Abnormal   Collection Time: 11/26/18  4:37 AM  Result Value Ref Range   Prothrombin Time 15.9 (H) 11.4 - 15.2 seconds   INR 1.3 (H) 0.8 - 1.2    Comment: (NOTE)  INR goal varies based on device and disease states. Performed at [facility], [address] [place], [place] [address]   CBC with Differential     Status: Abnormal   Collection Time: 11/26/18  4:37 AM  Result Value Ref Range   WBC 19.8 (H) 4.0 - 10.5 K/uL   RBC 4.63 3.87 - 5.11 MIL/uL   Hemoglobin 12.6 12.0 - 15.0 g/dL   HCT 40.6 36.0 - 46.0 %   MCV 87.7 80.0 - 100.0 fL   MCH 27.2 26.0 - 34.0 pg   MCHC 31.0 30.0 - 36.0 g/dL   RDW 14.2 11.5 - 15.5 %   Platelets 331 150 - 400 K/uL   nRBC 0.0 0.0 - 0.2 %   Neutrophils Relative % 80 %   Neutro Abs 15.7 (H) 1.7 - 7.7 K/uL   Lymphocytes Relative 9 %   Lymphs Abs 1.8 0.7 - 4.0 K/uL   Monocytes Relative 10 %   Monocytes Absolute 2.0 (H) 0.1 - 1.0 K/uL   Eosinophils Relative 0 %   Eosinophils Absolute 0.0 0.0 - 0.5 K/uL   Basophils Relative 0 %   Basophils Absolute 0.1 0.0 - 0.1 K/uL   Immature Granulocytes 1 %   Abs Immature Granulocytes 0.18 (H) 0.00 - 0.07 K/uL    Comment: Performed at [facility], [address] [place], [place] [address]    OSH MRCP Report reviewed -Dr. Rehman looked at images with Dr. Boles and old images, agrees with read of  stones in duct and cholecystitis        Assessment & Plan:  Zoriyah H Chesmore is a 77 y.o. female with acute cholecystitis and choledocholithiasis. She is more symptomatic from the cholecystitis based on her leukocytosis, but definitely needs to get her CBD stones removed. Dr. Rehman is doing that this morning. The patient is otherwise doing well and is a good candidate for cholecystectomy.   -PLAN: I counseled the patient about the indication, risks and benefits of laparoscopic cholecystectomy.  She understands there is a very small chance for bleeding, infection, injury to normal structures (including common bile duct), conversion to open surgery, persistent symptoms, evolution of postcholecystectomy diarrhea, need for secondary interventions, anesthesia reaction, cardiopulmonary issues and other risks not specifically detailed here. I described the expected recovery, the plan for follow-up and the restrictions during the recovery phase.  All questions were answered.  -OR for Lap cholecystectomy tomorrow AM  -IV antibiotics for now, Rocephin is ordered -COVID negative  -Spoke with her daughter, Paula Wood.  -NPO at midnight  All questions were answered to the satisfaction of the patient and family.  Mikal Wisman C Dougles Kimmey 11/26/2018, 12:12 PM       

## 2018-11-27 ENCOUNTER — Encounter (HOSPITAL_COMMUNITY): Payer: Self-pay | Admitting: Anesthesiology

## 2018-11-27 ENCOUNTER — Inpatient Hospital Stay (HOSPITAL_COMMUNITY): Payer: Medicare Other | Admitting: Anesthesiology

## 2018-11-27 ENCOUNTER — Encounter (HOSPITAL_COMMUNITY)
Admission: AD | Disposition: A | Discharge status: Home / Self Care | Payer: Self-pay | Source: Other Acute Inpatient Hospital | Attending: Internal Medicine

## 2018-11-27 HISTORY — PX: CHOLECYSTECTOMY: SHX55

## 2018-11-27 LAB — COMPREHENSIVE METABOLIC PANEL
ALT: 26 U/L (ref 0–44)
AST: 47 U/L — ABNORMAL HIGH (ref 15–41)
Albumin: 2.3 g/dL — ABNORMAL LOW (ref 3.5–5.0)
Alkaline Phosphatase: 150 U/L — ABNORMAL HIGH (ref 38–126)
Anion gap: 7 (ref 5–15)
BUN: 17 mg/dL (ref 8–23)
CO2: 23 mmol/L (ref 22–32)
Calcium: 7.9 mg/dL — ABNORMAL LOW (ref 8.9–10.3)
Chloride: 108 mmol/L (ref 98–111)
Creatinine, Ser: 0.9 mg/dL (ref 0.44–1.00)
GFR calc Af Amer: >60 mL/min (ref >60)
GFR calc non Af Amer: >60 mL/min (ref >60)
Glucose, Bld: 86 mg/dL (ref 70–99)
Potassium: 4.4 mmol/L (ref 3.5–5.1)
Sodium: 138 mmol/L (ref 135–145)
Total Bilirubin: 1.2 mg/dL (ref 0.3–1.2)
Total Protein: 5.1 g/dL — ABNORMAL LOW (ref 6.5–8.1)

## 2018-11-27 LAB — TSH: TSH: 1.674 u[IU]/mL (ref 0.350–4.500)

## 2018-11-27 LAB — CBC WITH DIFFERENTIAL/PLATELET
Abs Immature Granulocytes: 0.06 10*3/uL (ref 0.00–0.07)
Basophils Absolute: 0 10*3/uL (ref 0.0–0.1)
Basophils Relative: 0 %
Eosinophils Absolute: 0.1 10*3/uL (ref 0.0–0.5)
Eosinophils Relative: 0 %
HCT: 37.3 % (ref 36.0–46.0)
Hemoglobin: 11.3 g/dL — ABNORMAL LOW (ref 12.0–15.0)
Immature Granulocytes: 0 %
Lymphocytes Relative: 12 %
Lymphs Abs: 1.5 10*3/uL (ref 0.7–4.0)
MCH: 27 pg (ref 26.0–34.0)
MCHC: 30.3 g/dL (ref 30.0–36.0)
MCV: 89.2 fL (ref 80.0–100.0)
Monocytes Absolute: 0.9 10*3/uL (ref 0.1–1.0)
Monocytes Relative: 7 %
Neutro Abs: 10.8 10*3/uL — ABNORMAL HIGH (ref 1.7–7.7)
Neutrophils Relative %: 81 %
Platelets: 305 10*3/uL (ref 150–400)
RBC: 4.18 MIL/uL (ref 3.87–5.11)
RDW: 14.1 % (ref 11.5–15.5)
WBC: 13.4 10*3/uL — ABNORMAL HIGH (ref 4.0–10.5)
nRBC: 0 % (ref 0.0–0.2)

## 2018-11-27 LAB — AMYLASE: Amylase: 138 U/L — ABNORMAL HIGH (ref 28–100)

## 2018-11-27 LAB — SURGICAL PCR SCREEN
MRSA, PCR: NEGATIVE
Staphylococcus aureus: NEGATIVE

## 2018-11-27 LAB — PHOSPHORUS: Phosphorus: 1.6 mg/dL — ABNORMAL LOW (ref 2.5–4.6)

## 2018-11-27 LAB — MAGNESIUM: Magnesium: 1.9 mg/dL (ref 1.7–2.4)

## 2018-11-27 SURGERY — LAPAROSCOPIC CHOLECYSTECTOMY
Anesthesia: General

## 2018-11-27 MED ORDER — BUPIVACAINE HCL (PF) 0.5 % IJ SOLN
INTRAMUSCULAR | Status: AC
  Filled 2018-11-27: qty 30

## 2018-11-27 MED ORDER — PHENYLEPHRINE HCL (PRESSORS) 10 MG/ML IV SOLN
INTRAVENOUS | Status: DC | PRN
  Administered 2018-11-27: 40 ug via INTRAVENOUS
  Administered 2018-11-27: 80 ug via INTRAVENOUS

## 2018-11-27 MED ORDER — SODIUM CHLORIDE 0.9 % IR SOLN
Status: DC | PRN
  Administered 2018-11-27: 1000 mL

## 2018-11-27 MED ORDER — LACTATED RINGERS IV SOLN: INTRAVENOUS | Status: DC

## 2018-11-27 MED ORDER — HYDROMORPHONE HCL 1 MG/ML IJ SOLN
0.2500 mg | INTRAMUSCULAR | Status: DC | PRN
  Administered 2018-11-27: 0.5 mg via INTRAVENOUS
  Filled 2018-11-27: qty 0.5

## 2018-11-27 MED ORDER — HYDROCODONE-ACETAMINOPHEN 7.5-325 MG PO TABS: 1.0000 | ORAL_TABLET | Freq: Once | ORAL | Status: DC | PRN

## 2018-11-27 MED ORDER — SUFENTANIL CITRATE 50 MCG/ML IV SOLN
INTRAVENOUS | Status: DC | PRN
  Administered 2018-11-27 (×5): 5 ug via INTRAVENOUS

## 2018-11-27 MED ORDER — ETOMIDATE 2 MG/ML IV SOLN
INTRAVENOUS | Status: AC
  Filled 2018-11-27: qty 10

## 2018-11-27 MED ORDER — ROCURONIUM BROMIDE 100 MG/10ML IV SOLN
INTRAVENOUS | Status: DC | PRN
  Administered 2018-11-27: 30 mg via INTRAVENOUS
  Administered 2018-11-27: 10 mg via INTRAVENOUS

## 2018-11-27 MED ORDER — OXYCODONE HCL 5 MG PO TABS: 5.0000 mg | ORAL_TABLET | Freq: Four times a day (QID) | ORAL | Status: DC | PRN

## 2018-11-27 MED ORDER — BUPIVACAINE HCL (PF) 0.5 % IJ SOLN
INTRAMUSCULAR | Status: DC | PRN
  Administered 2018-11-27: 10 mL

## 2018-11-27 MED ORDER — PROPOFOL 10 MG/ML IV BOLUS
INTRAVENOUS | Status: DC | PRN
  Administered 2018-11-27: 20 mg via INTRAVENOUS
  Administered 2018-11-27: 100 mg via INTRAVENOUS

## 2018-11-27 MED ORDER — SODIUM CHLORIDE (PF) 0.9 % IJ SOLN
INTRAMUSCULAR | Status: AC
  Filled 2018-11-27: qty 10

## 2018-11-27 MED ORDER — HEMOSTATIC AGENTS (NO CHARGE) OPTIME
TOPICAL | Status: DC | PRN
  Administered 2018-11-27 (×2): 1 via TOPICAL

## 2018-11-27 MED ORDER — PHENYLEPHRINE 40 MCG/ML (10ML) SYRINGE FOR IV PUSH (FOR BLOOD PRESSURE SUPPORT)
PREFILLED_SYRINGE | INTRAVENOUS | Status: AC
  Filled 2018-11-27: qty 10

## 2018-11-27 MED ORDER — SUGAMMADEX SODIUM 500 MG/5ML IV SOLN
INTRAVENOUS | Status: DC | PRN
  Administered 2018-11-27: 200 mg via INTRAVENOUS

## 2018-11-27 MED ORDER — LIDOCAINE HCL (PF) 0.5 % IJ SOLN
INTRAMUSCULAR | Status: AC
  Filled 2018-11-27: qty 50

## 2018-11-27 MED ORDER — PROPOFOL 10 MG/ML IV BOLUS
INTRAVENOUS | Status: AC
  Filled 2018-11-27: qty 20

## 2018-11-27 MED ORDER — PROMETHAZINE HCL 25 MG/ML IJ SOLN: 6.2500 mg | INTRAMUSCULAR | Status: DC | PRN

## 2018-11-27 MED ORDER — SUCCINYLCHOLINE CHLORIDE 200 MG/10ML IV SOSY
PREFILLED_SYRINGE | INTRAVENOUS | Status: AC
  Filled 2018-11-27: qty 10

## 2018-11-27 MED ORDER — SODIUM CHLORIDE 0.9 % IV SOLN
2.0000 g | INTRAVENOUS | Status: AC
  Administered 2018-11-27: 2 g via INTRAVENOUS

## 2018-11-27 MED ORDER — ONDANSETRON HCL 4 MG PO TABS: 4.0000 mg | ORAL_TABLET | Freq: Every day | ORAL | 1 refills | Status: DC | PRN

## 2018-11-27 MED ORDER — SODIUM PHOSPHATES 45 MMOLE/15ML IV SOLN
20.0000 mmol | Freq: Once | INTRAVENOUS | Status: AC
  Administered 2018-11-27: 20 mmol via INTRAVENOUS
  Filled 2018-11-27: qty 6.67

## 2018-11-27 MED ORDER — MEPERIDINE HCL 50 MG/ML IJ SOLN: 6.2500 mg | INTRAMUSCULAR | Status: DC | PRN

## 2018-11-27 MED ORDER — LACTATED RINGERS IV SOLN
INTRAVENOUS | Status: DC
  Administered 2018-11-27: 07:00:00 via INTRAVENOUS

## 2018-11-27 MED ORDER — SUCCINYLCHOLINE CHLORIDE 20 MG/ML IJ SOLN
INTRAMUSCULAR | Status: DC | PRN
  Administered 2018-11-27: 100 mg via INTRAVENOUS

## 2018-11-27 MED ORDER — ONDANSETRON HCL 4 MG/2ML IJ SOLN
INTRAMUSCULAR | Status: AC
  Filled 2018-11-27: qty 2

## 2018-11-27 MED ORDER — ONDANSETRON HCL 4 MG/2ML IJ SOLN
INTRAMUSCULAR | Status: DC | PRN
  Administered 2018-11-27: 4 mg via INTRAVENOUS

## 2018-11-27 MED ORDER — DOCUSATE SODIUM 100 MG PO CAPS: 100.0000 mg | ORAL_CAPSULE | Freq: Two times a day (BID) | ORAL | 2 refills | Status: AC

## 2018-11-27 MED ORDER — SODIUM CHLORIDE 0.9 % IV SOLN
INTRAVENOUS | Status: AC
  Filled 2018-11-27: qty 2

## 2018-11-27 MED ORDER — SUFENTANIL CITRATE 50 MCG/ML IV SOLN
INTRAVENOUS | Status: AC
  Filled 2018-11-27: qty 1

## 2018-11-27 MED ORDER — POTASSIUM PHOSPHATES 15 MMOLE/5ML IV SOLN: 20.0000 mmol | Freq: Once | INTRAVENOUS | Status: DC

## 2018-11-27 MED ORDER — OXYCODONE HCL 5 MG PO TABS: 5.0000 mg | ORAL_TABLET | ORAL | 0 refills | Status: DC | PRN

## 2018-11-27 MED ORDER — LIDOCAINE 2% (20 MG/ML) 5 ML SYRINGE
INTRAMUSCULAR | Status: AC
  Filled 2018-11-27: qty 5

## 2018-11-27 SURGICAL SUPPLY — 51 items
APPLICATOR ARISTA FLEXITIP XL (MISCELLANEOUS) ×2 IMPLANT
APPLIER CLIP ROT 10 11.4 M/L (STAPLE) ×3
BAG RETRIEVAL 10 (BASKET) ×1
BAG RETRIEVAL 10MM (BASKET) ×1
BLADE SURG 15 STRL LF DISP TIS (BLADE) ×1 IMPLANT
BLADE SURG 15 STRL SS (BLADE) ×2
CHLORAPREP W/TINT 26 (MISCELLANEOUS) ×3 IMPLANT
CLIP APPLIE ROT 10 11.4 M/L (STAPLE) ×1 IMPLANT
CLOTH BEACON ORANGE TIMEOUT ST (SAFETY) ×3 IMPLANT
COVER LIGHT HANDLE STERIS (MISCELLANEOUS) ×6 IMPLANT
COVER WAND RF STERILE (DRAPES) ×2 IMPLANT
CUTTER FLEX LINEAR 45M (STAPLE) ×2 IMPLANT
DECANTER SPIKE VIAL GLASS SM (MISCELLANEOUS) ×3 IMPLANT
DERMABOND ADVANCED (GAUZE/BANDAGES/DRESSINGS) ×2
DERMABOND ADVANCED .7 DNX12 (GAUZE/BANDAGES/DRESSINGS) ×1 IMPLANT
ELECT REM PT RETURN 9FT ADLT (ELECTROSURGICAL) ×3
ELECTRODE REM PT RTRN 9FT ADLT (ELECTROSURGICAL) ×1 IMPLANT
FILTER SMOKE EVAC LAPAROSHD (FILTER) ×3 IMPLANT
GLOVE BIO SURGEON STRL SZ 6.5 (GLOVE) ×2 IMPLANT
GLOVE BIO SURGEON STRL SZ7 (GLOVE) ×4 IMPLANT
GLOVE BIO SURGEONS STRL SZ 6.5 (GLOVE) ×1
GLOVE BIOGEL PI IND STRL 6.5 (GLOVE) ×1 IMPLANT
GLOVE BIOGEL PI IND STRL 7.0 (GLOVE) ×3 IMPLANT
GLOVE BIOGEL PI INDICATOR 6.5 (GLOVE) ×2
GLOVE BIOGEL PI INDICATOR 7.0 (GLOVE) ×6
GOWN STRL REUS W/TWL LRG LVL3 (GOWN DISPOSABLE) ×9 IMPLANT
HEMOSTAT ARISTA ABSORB 3G PWDR (HEMOSTASIS) ×2 IMPLANT
HEMOSTAT SNOW SURGICEL 2X4 (HEMOSTASIS) ×3 IMPLANT
INST SET LAPROSCOPIC AP (KITS) ×3 IMPLANT
KIT TURNOVER KIT A (KITS) ×3 IMPLANT
MANIFOLD NEPTUNE II (INSTRUMENTS) ×3 IMPLANT
NDL INSUFFLATION 14GA 120MM (NEEDLE) ×1 IMPLANT
NEEDLE INSUFFLATION 14GA 120MM (NEEDLE) ×3 IMPLANT
NS IRRIG 1000ML POUR BTL (IV SOLUTION) ×3 IMPLANT
PACK LAP CHOLE LZT030E (CUSTOM PROCEDURE TRAY) ×3 IMPLANT
PAD ARMBOARD 7.5X6 YLW CONV (MISCELLANEOUS) ×3 IMPLANT
RELOAD 45 VASCULAR/THIN (ENDOMECHANICALS) ×3 IMPLANT
RELOAD STAPLE 45 2.5 WHT GRN (ENDOMECHANICALS) IMPLANT
SET BASIN LINEN APH (SET/KITS/TRAYS/PACK) ×3 IMPLANT
SLEEVE ENDOPATH XCEL 5M (ENDOMECHANICALS) ×3 IMPLANT
SUT MNCRL AB 4-0 PS2 18 (SUTURE) ×3 IMPLANT
SUT VICRYL 0 UR6 27IN ABS (SUTURE) ×3 IMPLANT
SYS BAG RETRIEVAL 10MM (BASKET) ×1
SYSTEM BAG RETRIEVAL 10MM (BASKET) ×1 IMPLANT
TROCAR ENDO BLADELESS 11MM (ENDOMECHANICALS) ×3 IMPLANT
TROCAR XCEL NON-BLD 5MMX100MML (ENDOMECHANICALS) ×3 IMPLANT
TROCAR XCEL UNIV SLVE 11M 100M (ENDOMECHANICALS) ×5 IMPLANT
TUBE CONNECTING 12'X1/4 (SUCTIONS) ×1
TUBE CONNECTING 12X1/4 (SUCTIONS) ×2 IMPLANT
TUBING INSUFFLATION (TUBING) ×3 IMPLANT
WARMER LAPAROSCOPE (MISCELLANEOUS) ×3 IMPLANT

## 2018-11-27 NOTE — Anesthesia Preprocedure Evaluation (Signed)
Anesthesia Evaluation    Airway Mallampati: II       Dental  (+) Lower Dentures, Partial Upper   Pulmonary    breath sounds clear to auscultation       Cardiovascular hypertension,  Rhythm:regular     Neuro/Psych    GI/Hepatic   Endo/Other  Hypothyroidism   Renal/GU      Musculoskeletal   Abdominal   Peds  Hematology   Anesthesia Other Findings ERCP yester 6/3  Fentanyl 25 mcg at induction delayed extubation 2 hrs later.  Awoke promptly p 80 mcg Narcan  States no issues after ERCP.  Happy w/anesthesia.  Requests same today  Reproductive/Obstetrics                             Anesthesia Physical Anesthesia Plan  ASA: III  Anesthesia Plan: General   Post-op Pain Management:    Induction:   PONV Risk Score and Plan:   Airway Management Planned:   Additional Equipment:   Intra-op Plan:   Post-operative Plan:   Informed Consent: I have reviewed the patients History and Physical, chart, labs and discussed the procedure including the risks, benefits and alternatives for the proposed anesthesia with the patient or authorized representative who has indicated his/her understanding and acceptance.       Plan Discussed with: Anesthesiologist  Anesthesia Plan Comments:         Anesthesia Quick Evaluation

## 2018-11-27 NOTE — Progress Notes (Signed)
[facility]  Laparoscopic cholecystectomy completed. Very infected gallbladder. Spoke with family. Pending how patient feels later this afternoon and how she is doing will decide then about if she could be discharged today versus tomorrow AM.   Roxicodone ordered PRN Regular diet ordered   Algis Greenhouse, MD [facility] [address] [place], [place] [address] [phone] (office)

## 2018-11-27 NOTE — Progress Notes (Signed)
PROGRESS NOTE    Jennet MaduroGainelle H Curbow  ZOX:[id number] DOB: [birth year] DOA: 11/25/2018 PCP: Toma DeitersHasanaj, Xaje A, MD   Brief Narrative:  HPI per Dr. Leandro ReasonerSrobona Chatterjee on 11/25/2018  Sherry Bridges is an 78 y.o. female with past medical history significant for hypertension and hyperlipidemia who was in her usual state of health until yesterday when she started having nausea and then vomiting.  Patient noted that the vomiting increased and then she developed right upper quadrant pain which became increasingly severe.  [facility] and was admitted for concerns for cholecystitis.  Patient was treated symptomatically with Zofran, Rocephin and IV fluids.  ERCP which showed 3 common bile duct stones the largest being 5 mm with common bile duct dilatation.  Dilatation of the pancreatic duct.  Patient was discussed with Dr.Rehman who accepted him for [facility] in the morning.  Patient states she is doing better with no vomiting since yesterday, improved nausea and reasonable pain management.  Patient denies fevers chills diarrhea or feeling unwell until yesterday when the vomiting and right upper quadrant pain started.  **Interim History Gastroenterology evaluated and patient was taken for ERCP on the morning of 11/26/2018 and it was unsuccessful as the common bile duct could not be cannulated because of a small ampullary orifice and difficult approach but a plastic stent was placed in the main pancreatic duct for prophylactic purposes. General Surgery was also consulted for cholecystectomy and this was done this morning.  Patient was rehydrated and will continue supportive care and diet advancement per General Surgery and Gastroenterology.  Assessment & Plan:   Active Problems:   Acute cholecystitis   Essential hypertension   Hyperlipidemia   Nausea and vomiting   Acute cholecystitis due to biliary calculus  Acute Cholecystitis due to Biliary Calculus  complicated by choledocholithiasis with concern for ascending cholangitis -[facility] and admitted with concern for cholecystitis in her nausea, vomiting, right upper quadrant pain -Patient was febrile 100.4 on admission and TMax the last 24 hours has been 100.3 and is now 98.2 -She was symptomatically treated with IV Zofran, IV ceftriaxone and IV fluid hydration at a rage of 125 mL/hr and changed to 50 mL/hr of LR and now IVF has been resumed and will reduce rate to 75 mL/hr -MRCP showed 3 common bile duct stones with the largest being 5 mm with common bile duct dilatation as well as dilatation of the pancreatic duct -SARS-CoV-2 Testing was negative  -WBC on Admission was 19.8 and trended down to 13.4 -Gastroenterology consulted for further evaluation recommendations and patient underwent ERCP on 11/26/2018 which showed an unsuccessful CBD cannulation because a small ampullary orifice and difficult process.  A plastic stent was placed in the main pancreatic duct for prophylactic purposes and Gastroenterology recommending repeating an ERCP at a later date -Will consult General Surgery for further evaluation and recommendations and timing of Cholecystectomy; Dr. Henreitta LeberBridges notified by Dr. Karilyn Cotaehman and patient underwent laparoscopic cholecystectomy this morning and is postoperative day 0.  She is yet to tolerate food at all has not tried to eat yet -Diet advancement per GI and General Surgery and they are recommending Regular Diet -Continue with pain control with IV morphine 1 mg every 3 as needed severe pain; Added po Oxycodone IR 5 mg po q6hprn for Moderate Pain -LFTs and T bili were normal but will add on a lipase level which was 17; Amylase this AM was 138  Nausea and Vomiting, improved -C/w Supportive Care and with Ondansetron 4 mg IV q6hprn N/V -IVF Hydration with NS continuing but at a reduced rate of 75 mL/hr  Hypothyroidism -TSH this AM was 1.674 -Continue  levothyroxine 88 mcg p.o. daily when able to take p.o.  Hyperlipidemia -Continue with Atorvastatin 40 mg p.o. daily  Normocytic Anemia -Likely Dilutional Drop and expect to slight drop Post-Operatively -Hb/Hct went from 12.6/40.6 -> 11.3/37.3 -Check Anemia Panel in the AM -Continue to Monitor for S/Sx of Bleeding; No Overt Bleeding noted -Repeat CBC in AM   Abnormal LFTs -Patient's AST worsened slightly and went from 17 -> 47 -AST went from 13 -> 26 -In the setting of ERCP -Continue to Monitor and Trend -Repeat CMP in AM   Hypophosphatemia -Patient's Phos Level this AM was 1.6 -Replete with IV KPhos 20 mmol -Continue to Monitor and Replete as Necessary -Repeat Phos Level in AM   DVT prophylaxis: SCDs Code Status: FULL CODE Family Communication: No family present at bedside  Disposition Plan: Remain Inpatient for continued Treatment and evaluation by General Surgery for Cholecystectomy   Consultants:   Gastroenterology Dr. Karilyn Cota  General Surgery Dr. Henreitta Leber    Procedures:  ERCP 11/26/2018  Findings:      The scout film was normal. The esophagus was successfully intubated       under direct vision. The scope was advanced to a normal major papilla in       the descending duodenum without detailed examination of the pharynx,       larynx and associated structures, and upper GI tract. The upper GI tract       was grossly normal. The bile duct could not be cannulated with Rx-44 and       RX-39 short-nosed traction sphincterotome. One 4 Fr by 5 cm plastic       stent with a single external pigtail and a single internal flap was       placed 4 cm into the ventral pancreatic duct. Clear fluid flowed through       the stent. The stent was in good position. Impression:               - Unsuccessful CBD cannulation because of small                            ampullary orifice and difficult approach.                           - One plastic stent was placed into the main                             pancreatic duct for prophylactic purposes.  Laparoscopic Cholecystectomy done by Dr. Henreitta Leber on 11/27/2018    Antimicrobials:  Anti-infectives (From admission, onward)   Start     Dose/Rate Route Frequency Ordered Stop   11/27/18 0715  cefoTEtan (CEFOTAN) 2 g in sodium chloride 0.9 % 100 mL IVPB     2 g 200 mL/hr over 30 Minutes Intravenous On call to O.R. 11/27/18 0704 11/27/18 0800   11/25/18 2100  cefTRIAXone (ROCEPHIN) 2 g in sodium chloride 0.9 % 100 mL IVPB     2 g 200 mL/hr over 30 Minutes Intravenous Every 24 hours 11/25/18 2012       Subjective: Patient was seen and examined at bedside  after her cholecystectomy and she is wanting to rest.  States her abdomen was sore.  Has not tried eating anything yet.  No nausea or vomiting currently.  No other concerns or complaints at this time.  Objective: Vitals:   11/27/18 0934 11/27/18 0945 11/27/18 1002 11/27/18 1015  BP: (!) 124/56 (!) 128/59 (!) 119/56 (!) 105/49  Pulse: 87 86 74 73  Resp: 18 19 19 14   Temp: 98.2 F (36.8 C)     TempSrc:      SpO2: 97% 96% 92% 91%  Weight:      Height:        Intake/Output Summary (Last 24 hours) at 11/27/2018 1319 Last data filed at 11/27/2018 9485 Gross per 24 hour  Intake 3524.65 ml  Output 0 ml  Net 3524.65 ml   Filed Weights   11/25/18 1951  Weight: 68.4 kg   Examination: Physical Exam:  Constitutional: Well-nourished, well-developed overweight Caucasian female currently no acute distress and appears calm and just come back from her surgical procedure and resting in bed Eyes: Lids and conjunctive are normal.  Sclera anicteric ENMT: External Ears and nose appear normal.  Grossly normal hearing  Neck: Appears supple with no JVD Respiratory: Slightly diminished to auscultation bilaterally no appreciable wheezing, rales, car.  Patient not tachypneic or using any accessory muscles to breathe Cardiovascular: Regular rate rhythm.  No appreciable murmurs, rubs, gallops.   No lower extremity edema noted. Abdomen: Soft, tender to palpate for.  Slightly distended.  Abdominal incisions appear clean dry intact.  Bowel sounds present times GU: Deferred Musculoskeletal: No contractures or cyanosis.  No joint fomites upper lower extremities Skin: Skin is warm dry no appreciable rashes or lesions limited skin evaluation Neurologic: Cranial nerves II through XII gross intact no appreciable focal deficits.  Romberg sign cerebellar reflexes were not assessed Psychiatric: Patient was a little drowsy and wanting to sleep.  She is oriented x3.  Not as anxious today.  Data Reviewed: I have personally reviewed following labs and imaging studies  CBC: Recent Labs  Lab 11/26/18 0437 11/27/18 0409  WBC 19.8* 13.4*  NEUTROABS 15.7* 10.8*  HGB 12.6 11.3*  HCT 40.6 37.3  MCV 87.7 89.2  PLT 331 305   Basic Metabolic Panel: Recent Labs  Lab 11/26/18 0437 11/27/18 0409  NA 136 138  K 4.2 4.4  CL 105 108  CO2 24 23  GLUCOSE 92 86  BUN 12 17  CREATININE 0.75 0.90  CALCIUM 8.3* 7.9*  MG  --  1.9  PHOS  --  1.6*   GFR: Estimated Creatinine Clearance: 49.7 mL/min (by C-G formula based on SCr of 0.9 mg/dL). Liver Function Tests: Recent Labs  Lab 11/26/18 0437 11/27/18 0409  AST 17 47*  ALT 13 26  ALKPHOS 46 150*  BILITOT 0.7 1.2  PROT 5.7* 5.1*  ALBUMIN 2.7* 2.3*   Recent Labs  Lab 11/26/18 0437 11/27/18 0409  LIPASE 21  --   AMYLASE  --  138*   No results for input(s): AMMONIA in the last 168 hours. Coagulation Profile: Recent Labs  Lab 11/26/18 0437  INR 1.3*   Cardiac Enzymes: No results for input(s): CKTOTAL, CKMB, CKMBINDEX, TROPONINI in the last 168 hours. BNP (last 3 results) No results for input(s): PROBNP in the last 8760 hours. HbA1C: No results for input(s): HGBA1C in the last 72 hours. CBG: No results for input(s): GLUCAP in the last 168 hours. Lipid Profile: No results for input(s): CHOL, HDL, LDLCALC, TRIG, CHOLHDL, LDLDIRECT  in the last 72 hours. Thyroid Function Tests: Recent Labs    11/27/18 0409  TSH 1.674   Anemia Panel: No results for input(s): VITAMINB12, FOLATE, FERRITIN, TIBC, IRON, RETICCTPCT in the last 72 hours. Sepsis Labs: No results for input(s): PROCALCITON, LATICACIDVEN in the last 168 hours.  Recent Results (from the past 240 hour(s))  SARS Coronavirus 2 (CEPHEID - Performed in [facility] hospital lab), Hosp Order     Status: None   Collection Time: 11/25/18  7:26 PM  Result Value Ref Range Status   SARS Coronavirus 2 NEGATIVE NEGATIVE Final    Comment: (NOTE) If result is NEGATIVE SARS-CoV-2 target nucleic acids are NOT DETECTED. The SARS-CoV-2 RNA is generally detectable in upper and lower  respiratory specimens during the acute phase of infection. The lowest  concentration of SARS-CoV-2 viral copies this assay can detect is 250  copies / mL. A negative result does not preclude SARS-CoV-2 infection  and should not be used as the sole basis for treatment or other  patient management decisions.  A negative result may occur with  improper specimen collection / handling, submission of specimen other  than nasopharyngeal swab, presence of viral mutation(s) within the  areas targeted by this assay, and inadequate number of viral copies  (<250 copies / mL). A negative result must be combined with clinical  observations, patient history, and epidemiological information. If result is POSITIVE SARS-CoV-2 target nucleic acids are DETECTED. The SARS-CoV-2 RNA is generally detectable in upper and lower  respiratory specimens dur ing the acute phase of infection.  Positive  results are indicative of active infection with SARS-CoV-2.  Clinical  correlation with patient history and other diagnostic information is  necessary to determine patient infection status.  Positive results do  not rule out bacterial infection or co-infection with other viruses. If result is PRESUMPTIVE POSTIVE  SARS-CoV-2 nucleic acids MAY BE PRESENT.   A presumptive positive result was obtained on the submitted specimen  and confirmed on repeat testing.  While 2019 novel coronavirus  (SARS-CoV-2) nucleic acids may be present in the submitted sample  additional confirmatory testing may be necessary for epidemiological  and / or clinical management purposes  to differentiate between  SARS-CoV-2 and other Sarbecovirus currently known to infect humans.  If clinically indicated additional testing with an alternate test  methodology [phone]) is advised. The SARS-CoV-2 RNA is generally  detectable in upper and lower respiratory sp ecimens during the acute  phase of infection. The expected result is Negative. Fact Sheet for Patients:  BoilerBrush.com.cy Fact Sheet for Healthcare Providers: https://pope.com/ This test is not yet approved or cleared by the [place] FDA and has been authorized for detection and/or diagnosis of SARS-CoV-2 by FDA under an Emergency Use Authorization (EUA).  This EUA will remain in effect (meaning this test can be used) for the duration of the COVID-19 declaration under Section 564(b)(1) of the Act, 21 U.S.C. section 360bbb-3(b)(1), unless the authorization is terminated or revoked sooner. Performed at [facility], [address], [place], [place] [address]   Surgical PCR screen     Status: None   Collection Time: 11/27/18 12:50 AM  Result Value Ref Range Status   MRSA, PCR NEGATIVE NEGATIVE Final   Staphylococcus aureus NEGATIVE NEGATIVE Final    Comment: (NOTE) The Xpert SA Assay (FDA approved for NASAL specimens in patients 94 years of age and older), is one component of a comprehensive surveillance program. It is not intended to diagnose infection nor to guide  or monitor treatment. Performed at [facility], [address] [place], [place] [address]     Radiology Studies: Dg C-arm 1-60 Min-no Report   Result Date: 11/26/2018 Fluoroscopy was utilized by the requesting physician.  No radiographic interpretation.    Scheduled Meds: . levothyroxine  88 mcg Oral Q0600   Continuous Infusions: . sodium chloride 125 mL/hr at 11/27/18 1041  . cefTRIAXone (ROCEPHIN)  IV 2 g (11/26/18 2146)  . sodium phosphate  Dextrose 5% IVPB 20 mmol (11/27/18 1253)    LOS: 2 days   Merlene Laughter, DO [facility] PAGER is on AMION  If 7PM-7AM, please contact night-coverage [email or web address] Password [email or web address] 11/27/2018, 1:19 PM

## 2018-11-27 NOTE — Op Note (Signed)
Operative Note   Preoperative Diagnosis: Acute cholecystitis   Postoperative Diagnosis: Same   Procedure(s) Performed: Laparoscopic cholecystectomy   Surgeon: Lillia Abed C. Henreitta Leber, MD   Assistants: No qualified resident was available   Anesthesia: General endotracheal   Anesthesiologist: Arbie Cookey, MD    Specimens: Gallbladder    Estimated Blood Loss: Minimal    Blood Replacement: None    Complications: None    Operative Findings: Distended, infected gallbladder with purulent drainage   Class: Infected    Procedure: The patient was taken to the operating room and placed supine. General endotracheal anesthesia was induced. Intravenous antibiotics were administered per protocol. An orogastric tube positioned to decompress the stomach. The abdomen was prepared and draped in the usual sterile fashion.    A supraumbilical incision was made and a Veress technique was utilized to achieve pneumoperitoneum to 15 mmHg with carbon dioxide. A 11 mm optiview port was placed through the supraumbilical region, and a 10 mm 0-degree operative laparoscope was introduced. The area underlying the trocar and Veress needle were inspected and without evidence of injury.  Remaining trocars were placed under direct vision. Two 5 mm ports were placed in the right abdomen, between the anterior axillary and midclavicular line.  A final 11 mm port was placed through the mid-epigastrium, near the falciform ligament.   The gallbladder was very distended and inflamed with a rind. There was significant omental attachments.  The gallbladder was decompressed to allow for better grasping, and the omentum was stripped down with cautery to free the gallbladder. Once this was performed, the gallbladder fundus was elevated cephalad and the infundibulum was retracted to the patient's right. The gallbladder/cystic duct junction was skeletonized. The cystic artery noted in the triangle of Calot and was also  skeletonized.  The gallbladder was very large and the cystic duct was dilated.  We then continued liberal medial and lateral dissection until the critical view of safety was achieved.  I ensured that we were completely isolated on a pedicle and that the hepatic plate was behind the cystic duct and artery.  At some point we felt that the umbilical port was leaking, so this was changed out under direct visualization with the camera through the epigastric port.    The cystic artery was triply clipped and divided. Due to the dilation of the cystic duct, a EndoGIA vascular load was used to come across the cystic duct ensuring the common duct was clear from the stapling device. The gallbladder was then dissected from the liver bed with electrocautery. The specimen was placed in an Endopouch. Due to COVID 19, hemostasis was achieved and Arista and Surgical Snow were placed in the gallbladder bed.  Pneumoperitoneum was released through the PlumAway.  Then the Endopouch was removed through the epigastric port site. Trocars were removed.   0 Vicryl fascial sutures were used to close the epigastric and umbilical port sites.  and Skin incisions were closed with 4-0 Monocryl subcuticular sutures and Dermabond. The patient was awakened from anesthesia and extubated without complication.    Algis Greenhouse, MD [facility] [address] [place], [place] [address] [phone] (office)

## 2018-11-27 NOTE — Interval H&P Note (Signed)
History and Physical Interval Note:  11/27/2018 7:11 AM  Sherry Bridges  has presented today for surgery, with the diagnosis of acute cholecystitis.  The various methods of treatment have been discussed with the patient and family. After consideration of risks, benefits and other options for treatment, the patient has consented to  Procedure(s): LAPAROSCOPIC CHOLECYSTECTOMY possible open (N/A) as a surgical intervention.  The patient's history has been reviewed, patient examined, no change in status, stable for surgery.  I have reviewed the patient's chart and labs.  Questions were answered to the patient's satisfaction.    Patient feeling somewhat better. Dr. Karilyn Cota did ERCP yesterday but due to edema was unable to cannulate or pass stent into CBD. Stented the pancreatic duct. Bile was draining so there was no biliary obstruction, bilirubin up mildly today to 1.2 after ERCP. Discussed surgery with patient's family yesterday, answered there questions and concerns and they were in agreement to proceed. Patient without any questions this AM. Plan for lap chole.  Lucretia Roers

## 2018-11-27 NOTE — Transfer of Care (Signed)
Immediate Anesthesia Transfer of Care Note  Patient: Sherry Bridges  Procedure(s) Performed: LAPAROSCOPIC CHOLECYSTECTOMY (N/A )  Patient Location: PACU  Anesthesia Type:General  Level of Consciousness: awake and patient cooperative  Airway & Oxygen Therapy: Patient Spontanous Breathing and non-rebreather face mask  Post-op Assessment: Report given to RN and Post -op Vital signs reviewed and stable  Post vital signs: Reviewed and stable  Last Vitals:  Vitals Value Taken Time  BP    Temp    Pulse    Resp    SpO2      Last Pain:  Vitals:   11/27/18 0635  TempSrc:   PainSc: 0-No pain      Patients Stated Pain Goal: 3 (11/26/18 1042)  Complications: No apparent anesthesia complications

## 2018-11-27 NOTE — Discharge Summary (Signed)
Physician Discharge Summary  Sherry Bridges ZOX:[id number] DOB: 1940/10/18 DOA: 11/25/2018  PCP: Toma Deiters, MD  Admit date: 11/25/2018 Discharge date: 11/27/2018  Admitted From: Home Disposition: Home  Recommendations for Outpatient Follow-up:  1. Follow up with PCP Dr. Olena Leatherwood  in 1-2 weeks 2. Follow up with Dr. Henreitta Leber in General Surgery in 2 weeks. Post Op Phone Call on 12/11/2018  3. Follow up with Dr. Karilyn Cota in Gastroenterology for repeat ERCP within 1 week  4. Please obtain CMP/CBC, Mag, Phos in one week 5. Please follow up on the following pending results:  Home Health: No Equipment/Devices: None   Discharge Condition: Stable  CODE STATUS: FULL CODE Diet recommendation: Regular Diet   Brief/Interim Summary: HPI per Dr. Leandro Reasoner on 11/25/2018  Sherry Bridges an 78 y.o.femalewith past medical history significant for hypertension and hyperlipidemia who was in her usual state of health until yesterday when she started having nausea and then vomiting. Patient noted that the vomiting increased and then she developed right upper quadrant pain which became increasingly severe. Patient was seen at [facility] rocking him and was admitted for concerns for cholecystitis. Patient was treated symptomatically with Zofran, Rocephin and IV fluids. ERCP which showed 3 common bile duct stones the largest being 5 mm with common bile duct dilatation. Dilatation of the pancreatic duct. Patient was discussed with Dr.Rehmanwho accepted him for transfer to [facility] for ERCP in the morning.  Patient states she is doing better with no vomiting since yesterday, improved nausea and reasonable pain management. Patient denies fevers chills diarrhea or feeling unwell until yesterday when the vomiting and right upper quadrant pain started.  **Interim History Gastroenterology evaluated and patient was taken for ERCP on the morning of 11/26/2018 and it was unsuccessful as the common bile  duct could not be cannulated because of a small ampullary orifice and difficult approach but a plastic stent was placed in the main pancreatic duct for prophylactic purposes. General Surgery was also consulted for cholecystectomy and this was done this morning.  Patient was rehydrated and will continue supportive care and diet advancement per General Surgery and Gastroenterology.  Postoperatively patient did well and tolerated eating and drinking ambulated to the bathroom without assistance.  She is deemed stable from a surgical perspective to discharge home and will need to follow-up with PCP, general surgery as well as gastroenterology in outpatient setting.  Patient has a postoperative phone call with Dr. Henreitta Leber in 2 weeks and will need to follow-up with Dr. Karilyn Cota in 1 week for repeat ERCP.  Discharge Diagnoses:  Active Problems:   Acute cholecystitis   Essential hypertension   Hyperlipidemia   Nausea and vomiting   Acute cholecystitis due to biliary calculus  Acute Cholecystitis due to Biliary Calculus complicated by choledocholithiasis  -Patient was transferred from [facility] and admitted with concern for cholecystitis in her nausea, vomiting, right upper quadrant pain -Patient was febrile 100.4 on admission and TMax the last 24 hours has been 100.3 and is now 98.2 -She was symptomatically treated with IV Zofran, IV ceftriaxone and IV fluid hydration at a rage of 125 mL/hr and changed to 50 mL/hr of LR and now IVF has been resumed and will reduce rate to 75 mL/hr -MRCP showed 3 common bile duct stones with the largest being 5 mm with common bile duct dilatation as well as dilatation of the pancreatic duct -SARS-CoV-2 Testing was negative  -WBC on Admission was 19.8 and trended down to 13.4 -Gastroenterology consulted for  further evaluation recommendations and patient underwent ERCP on 11/26/2018 which showed an unsuccessful CBD cannulation because a small ampullary orifice and difficult  process.  A plastic stent was placed in the main pancreatic duct for prophylactic purposes and Gastroenterology recommending repeating an ERCP at a later date and this will need to be set up within 1 week -Will consult General Surgery for further evaluation and recommendations and timing of Cholecystectomy; Dr. Henreitta Leber notified by Dr. Karilyn Cota and patient underwent laparoscopic cholecystectomy this morning and is postoperative day 0.  Per operative note patient's gallbladder was distended and infected with purulent drainage -She was able to tolerate a diet and ambulated well and had no issues. -Diet advancement per GI and General Surgery and they are recommending Regular Diet -Continue with pain control with IV morphine 1 mg every 3 as needed severe pain; Added po Oxycodone IR 5 mg po q6hprn for Moderate Pain and will be discharged with this along with docusate and Zofran -LFTs and T bili were normal but will add on a lipase level which was 17; Amylase this AM was 138 -We will need to follow-up with PCP, general surgery and gastroenterology in outpatient setting  Nausea and Vomiting, improved -C/w Supportive Care and with Ondansetron 4 mg IV q6hprn N/V -IVF Hydration with NS continuing but at a reduced rate of 75 mL/hr until patient is discharged  Hypothyroidism -TSH this AM was 1.674 -Continue levothyroxine 88 mcg p.o. daily when able to take p.o.  Hyperlipidemia -Continue with Atorvastatin 40 mg p.o. daily  Normocytic Anemia -Likely Dilutional Drop and expect to slight drop Post-Operatively -Hb/Hct went from 12.6/40.6 -> 11.3/37.3 -Check Anemia Panel as an outpatient -Continue to Monitor for S/Sx of Bleeding; No Overt Bleeding noted -Repeat CBC as an outpatient  Abnormal LFTs -Patient's AST worsened slightly and went from 17 -> 47 -AST went from 13 -> 26 -In the setting of ERCP -Continue to Monitor and Trend -Repeat CMP as an outpatient  Hypophosphatemia -Patient's Phos Level  this AM was 1.6 -Replete with IV KPhos 20 mmol prior to discharge -Continue to Monitor and Replete as Necessary -Repeat Phos Level within 1 week  Discharge Instructions  Discharge Instructions    Call MD for:  difficulty breathing, headache or visual disturbances   Complete by:  As directed    Call MD for:  extreme fatigue   Complete by:  As directed    Call MD for:  persistant dizziness or light-headedness   Complete by:  As directed    Call MD for:  persistant nausea and vomiting   Complete by:  As directed    Call MD for:  redness, tenderness, or signs of infection (pain, swelling, redness, odor or green/yellow discharge around incision site)   Complete by:  As directed    Call MD for:  severe uncontrolled pain   Complete by:  As directed    Call MD for:  temperature >100.4   Complete by:  As directed    Diet - low sodium heart healthy   Complete by:  As directed    Discharge instructions   Complete by:  As directed    You were cared for by a hospitalist during your hospital stay. If you have any questions about your discharge medications or the care you received while you were in the hospital after you are discharged, you can call the unit and ask to speak with the hospitalist on call if the hospitalist that took care of you is not available.  Once you are discharged, your primary care physician will handle any further medical issues. Please note that NO REFILLS for any discharge medications will be authorized once you are discharged, as it is imperative that you return to your primary care physician (or establish a relationship with a primary care physician if you do not have one) for your aftercare needs so that they can reassess your need for medications and monitor your lab values.  Follow up with PCP, General Surgery, and Gastroenterology. Take all medications as prescribed. If symptoms change or worsen please return to the ED for evaluation   Increase activity slowly   Complete  by:  As directed      Allergies as of 11/27/2018   No Known Allergies     Medication List    TAKE these medications   atorvastatin 40 MG tablet Commonly known as:  LIPITOR Take 40 mg by mouth daily.   co-enzyme Q-10 30 MG capsule Take 30 mg by mouth daily.   docusate sodium 100 MG capsule Commonly known as:  Colace Take 1 capsule (100 mg total) by mouth 2 (two) times daily.   Euthyrox 88 MCG tablet Generic drug:  levothyroxine Take 88 mcg by mouth daily.   Fish Oil 1000 MG Caps Take 1 capsule by mouth daily.   multivitamin tablet Take 1 tablet by mouth daily.   ondansetron 4 MG tablet Commonly known as:  Zofran Take 1 tablet (4 mg total) by mouth daily as needed for nausea or vomiting.   oxyCODONE 5 MG immediate release tablet Commonly known as:  Oxy IR/ROXICODONE Take 1 tablet (5 mg total) by mouth every 4 (four) hours as needed for moderate pain.      Follow-up Information    Follow up On 12/11/2018.   Why:  post operative Phone Call on 12/11/18 @ 1:00PM; Dr. Henreitta Leber will call you so that you do not have to come to clinic; if you need to be seen in person or have questions or concerns, please call the office.          No Known Allergies  Consultations:  Gastroenterology  General Surgery  Procedures/Studies: Dg C-arm 1-60 Min-no Report  Result Date: 11/26/2018 Fluoroscopy was utilized by the requesting physician.  No radiographic interpretation.    Subjective: Patient was seen and examined at bedside after her cholecystectomy and she was wanting to rest.  States her abdomen was sore.  Has not tried eating anything yet to tolerate diet and ambulate.  No nausea or vomiting currently.  No other concerns or complaints at this time and deemed medically stable for discharge home.  Discharge Exam: Vitals:   11/27/18 1015 11/27/18 1429  BP: (!) 105/49 103/67  Pulse: 73 73  Resp: 14 17  Temp:  97.7 F (36.5 C)  SpO2: 91% 92%   Vitals:   11/27/18 0945  11/27/18 1002 11/27/18 1015 11/27/18 1429  BP: (!) 128/59 (!) 119/56 (!) 105/49 103/67  Pulse: 86 74 73 73  Resp: Temp:    97.7 F (36.5 C)  TempSrc:    Oral  SpO2: 96% 92% 91% 92%  Weight:      Height:       Constitutional: Well-nourished, well-developed overweight Caucasian female currently no acute distress and appears calm and just come back from her surgical procedure and resting in bed Eyes: Lids and conjunctive are normal.  Sclera anicteric ENMT: External Ears and nose appear normal.  Grossly normal hearing  Neck:  Appears supple with no JVD Respiratory: Slightly diminished to auscultation bilaterally no appreciable wheezing, rales, car.  Patient not tachypneic or using any accessory muscles to breathe Cardiovascular: Regular rate rhythm.  No appreciable murmurs, rubs, gallops.  No lower extremity edema noted. Abdomen: Soft, tender to palpate for.  Slightly distended.  Abdominal incisions appear clean dry intact.  Bowel sounds present times GU: Deferred Musculoskeletal: No contractures or cyanosis.  No joint fomites upper lower extremities Skin: Skin is warm dry no appreciable rashes or lesions limited skin evaluation Neurologic: Cranial nerves II through XII gross intact no appreciable focal deficits.  Romberg sign cerebellar reflexes were not assessed Psychiatric: Patient was a little drowsy and wanting to sleep.  She is oriented x3.  Not as anxious today.  The results of significant diagnostics from this hospitalization (including imaging, microbiology, ancillary and laboratory) are listed below for reference.    Microbiology: Recent Results (from the past 240 hour(s))  SARS Coronavirus 2 (CEPHEID - Performed in [facility] hospital lab), Hosp Order     Status: None   Collection Time: 11/25/18  7:26 PM  Result Value Ref Range Status   SARS Coronavirus 2 NEGATIVE NEGATIVE Final    Comment: (NOTE) If result is NEGATIVE SARS-CoV-2 target nucleic acids are NOT  DETECTED. The SARS-CoV-2 RNA is generally detectable in upper and lower  respiratory specimens during the acute phase of infection. The lowest  concentration of SARS-CoV-2 viral copies this assay can detect is 250  copies / mL. A negative result does not preclude SARS-CoV-2 infection  and should not be used as the sole basis for treatment or other  patient management decisions.  A negative result may occur with  improper specimen collection / handling, submission of specimen other  than nasopharyngeal swab, presence of viral mutation(s) within the  areas targeted by this assay, and inadequate number of viral copies  (<250 copies / mL). A negative result must be combined with clinical  observations, patient history, and epidemiological information. If result is POSITIVE SARS-CoV-2 target nucleic acids are DETECTED. The SARS-CoV-2 RNA is generally detectable in upper and lower  respiratory specimens dur ing the acute phase of infection.  Positive  results are indicative of active infection with SARS-CoV-2.  Clinical  correlation with patient history and other diagnostic information is  necessary to determine patient infection status.  Positive results do  not rule out bacterial infection or co-infection with other viruses. If result is PRESUMPTIVE POSTIVE SARS-CoV-2 nucleic acids MAY BE PRESENT.   A presumptive positive result was obtained on the submitted specimen  and confirmed on repeat testing.  While 2019 novel coronavirus  (SARS-CoV-2) nucleic acids may be present in the submitted sample  additional confirmatory testing may be necessary for epidemiological  and / or clinical management purposes  to differentiate between  SARS-CoV-2 and other Sarbecovirus currently known to infect humans.  If clinically indicated additional testing with an alternate test  methodology [phone]) is advised. The SARS-CoV-2 RNA is generally  detectable in upper and lower respiratory sp ecimens during  the acute  phase of infection. The expected result is Negative. Fact Sheet for Patients:  BoilerBrush.com.cy Fact Sheet for Healthcare Providers: https://pope.com/ This test is not yet approved or cleared by the [place] FDA and has been authorized for detection and/or diagnosis of SARS-CoV-2 by FDA under an Emergency Use Authorization (EUA).  This EUA will remain in effect (meaning this test can be used) for the duration of the COVID-19 declaration under Section  564(b)(1) of the Act, 21 U.S.C. section 360bbb-3(b)(1), unless the authorization is terminated or revoked sooner. Performed at [facility], [address], [place], [place] [address]   Surgical PCR screen     Status: None   Collection Time: 11/27/18 12:50 AM  Result Value Ref Range Status   MRSA, PCR NEGATIVE NEGATIVE Final   Staphylococcus aureus NEGATIVE NEGATIVE Final    Comment: (NOTE) The Xpert SA Assay (FDA approved for NASAL specimens in patients 25 years of age and older), is one component of a comprehensive surveillance program. It is not intended to diagnose infection nor to guide or monitor treatment. Performed at [facility], [address] [place], [place] [address]    Labs: BNP (last 3 results) No results for input(s): BNP in the last 8760 hours. Basic Metabolic Panel: Recent Labs  Lab 11/26/18 0437 11/27/18 0409  NA 136 138  K 4.2 4.4  CL 105 108  CO2 24 23  GLUCOSE 92 86  BUN 12 17  CREATININE 0.75 0.90  CALCIUM 8.3* 7.9*  MG  --  1.9  PHOS  --  1.6*   Liver Function Tests: Recent Labs  Lab 11/26/18 0437 11/27/18 0409  AST 17 47*  ALT 13 26  ALKPHOS 46 150*  BILITOT 0.7 1.2  PROT 5.7* 5.1*  ALBUMIN 2.7* 2.3*   Recent Labs  Lab 11/26/18 0437 11/27/18 0409  LIPASE 21  --   AMYLASE  --  138*   No results for input(s): AMMONIA in the last 168 hours. CBC: Recent Labs  Lab 11/26/18 0437 11/27/18 0409  WBC 19.8* 13.4*   NEUTROABS 15.7* 10.8*  HGB 12.6 11.3*  HCT 40.6 37.3  MCV 87.7 89.2  PLT 331 305   Cardiac Enzymes: No results for input(s): CKTOTAL, CKMB, CKMBINDEX, TROPONINI in the last 168 hours. BNP: Invalid input(s): POCBNP CBG: No results for input(s): GLUCAP in the last 168 hours. D-Dimer No results for input(s): DDIMER in the last 72 hours. Hgb A1c No results for input(s): HGBA1C in the last 72 hours. Lipid Profile No results for input(s): CHOL, HDL, LDLCALC, TRIG, CHOLHDL, LDLDIRECT in the last 72 hours. Thyroid function studies Recent Labs    11/27/18 0409  TSH 1.674   Anemia work up No results for input(s): VITAMINB12, FOLATE, FERRITIN, TIBC, IRON, RETICCTPCT in the last 72 hours. Urinalysis No results found for: COLORURINE, APPEARANCEUR, LABSPEC, PHURINE, GLUCOSEU, HGBUR, BILIRUBINUR, KETONESUR, PROTEINUR, UROBILINOGEN, NITRITE, LEUKOCYTESUR Sepsis Labs Invalid input(s): PROCALCITONIN,  WBC,  LACTICIDVEN Microbiology Recent Results (from the past 240 hour(s))  SARS Coronavirus 2 (CEPHEID - Performed in [facility] hospital lab), Hosp Order     Status: None   Collection Time: 11/25/18  7:26 PM  Result Value Ref Range Status   SARS Coronavirus 2 NEGATIVE NEGATIVE Final    Comment: (NOTE) If result is NEGATIVE SARS-CoV-2 target nucleic acids are NOT DETECTED. The SARS-CoV-2 RNA is generally detectable in upper and lower  respiratory specimens during the acute phase of infection. The lowest  concentration of SARS-CoV-2 viral copies this assay can detect is 250  copies / mL. A negative result does not preclude SARS-CoV-2 infection  and should not be used as the sole basis for treatment or other  patient management decisions.  A negative result may occur with  improper specimen collection / handling, submission of specimen other  than nasopharyngeal swab, presence of viral mutation(s) within the  areas targeted by this assay, and inadequate number of viral copies  (<250  copies /  mL). A negative result must be combined with clinical  observations, patient history, and epidemiological information. If result is POSITIVE SARS-CoV-2 target nucleic acids are DETECTED. The SARS-CoV-2 RNA is generally detectable in upper and lower  respiratory specimens dur ing the acute phase of infection.  Positive  results are indicative of active infection with SARS-CoV-2.  Clinical  correlation with patient history and other diagnostic information is  necessary to determine patient infection status.  Positive results do  not rule out bacterial infection or co-infection with other viruses. If result is PRESUMPTIVE POSTIVE SARS-CoV-2 nucleic acids MAY BE PRESENT.   A presumptive positive result was obtained on the submitted specimen  and confirmed on repeat testing.  While 2019 novel coronavirus  (SARS-CoV-2) nucleic acids may be present in the submitted sample  additional confirmatory testing may be necessary for epidemiological  and / or clinical management purposes  to differentiate between  SARS-CoV-2 and other Sarbecovirus currently known to infect humans.  If clinically indicated additional testing with an alternate test  methodology [phone]([id number]) is advised. The SARS-CoV-2 RNA is generally  detectable in upper and lower respiratory sp ecimens during the acute  phase of infection. The expected result is Negative. Fact Sheet for Patients:  BoilerBrush.com.cyhttps://www.fda.gov/media/136312/download Fact Sheet for Healthcare Providers: https://pope.com/https://www.fda.gov/media/136313/download This test is not yet approved or cleared by the [place] FDA and has been authorized for detection and/or diagnosis of SARS-CoV-2 by FDA under an Emergency Use Authorization (EUA).  This EUA will remain in effect (meaning this test can be used) for the duration of the COVID-19 declaration under Section 564(b)(1) of the Act, 21 U.S.C. section 360bbb-3(b)(1), unless the authorization is terminated or revoked  sooner. Performed at [facility], [address] [place], [place] [address]   Surgical PCR screen     Status: None   Collection Time: 11/27/18 12:50 AM  Result Value Ref Range Status   MRSA, PCR NEGATIVE NEGATIVE Final   Staphylococcus aureus NEGATIVE NEGATIVE Final    Comment: (NOTE) The Xpert SA Assay (FDA approved for NASAL specimens in patients 78 years of age and older), is one component of a comprehensive surveillance program. It is not intended to diagnose infection nor to guide or monitor treatment. Performed at [facility], [address] [place], [place] [address]    Time coordinating discharge: 35 minutes  SIGNED:  Merlene Laughtermair Latif Sheikh, DO [facility] 11/27/2018, 5:03 PM Pager is on AMION  If 7PM-7AM, please contact night-coverage [email or web address] Password [email or web address]

## 2018-11-27 NOTE — Anesthesia Postprocedure Evaluation (Signed)
Anesthesia Post Note  Patient: Texanna H Kessner  Procedure(s) Performed: LAPAROSCOPIC CHOLECYSTECTOMY (N/A )  Patient location during evaluation: PACU Anesthesia Type: General Level of consciousness: awake and patient uncooperative Pain management: satisfactory to patient Vital Signs Assessment: post-procedure vital signs reviewed and stable Respiratory status: spontaneous breathing and patient connected to nasal cannula oxygen Cardiovascular status: stable Postop Assessment: no apparent nausea or vomiting Anesthetic complications: no     Last Vitals:  Vitals:   11/27/18 1002 11/27/18 1015  BP: (!) 119/56 (!) 105/49  Pulse: 74 73  Resp: 19 14  Temp:    SpO2: 92% 91%    Last Pain:  Vitals:   11/27/18 1015  TempSrc:   PainSc: Asleep                 Venice Liz

## 2018-11-27 NOTE — Progress Notes (Signed)
IV removed, instructions reviewed with patient, verbalized understanding. Patient transported via wheelchair to short stay entrance.

## 2018-11-27 NOTE — Discharge Instructions (Signed)
Discharge Laparoscopic Surgery Instructions:  Common Complaints: Right shoulder pain is common after laparoscopic surgery. This is secondary to the gas used in the surgery being trapped under the diaphragm.  Walk to help your body absorb the gas. This will improve in a few days. Pain at the port sites are common, especially the larger port sites. This will improve with time.  Some nausea is common and poor appetite. The main goal is to stay hydrated the first few days after surgery.  Dr. Patty Sermonsehman's office will notify you with your plans for your repeat ERCP.   Diet/ Activity: Diet as tolerated. You may not have an appetite, but it is important to stay hydrated. Drink 64 ounces of water a day. Your appetite will return with time.  Shower per your regular routine daily.  Do not take hot showers. Take warm showers that are less than 10 minutes. Rest and listen to your body, but do not remain in bed all day.  Walk everyday for at least 15-20 minutes. Deep cough and move around every 1-2 hours in the first few days after surgery.  Do not lift > 10 lbs, perform excessive bending, pushing, pulling, squatting for 1-2 weeks after surgery.  Do not pick at the dermabond glue on your incision sites.  This glue film will remain in place for 1-2 weeks and will start to peel off.  Do not place lotions or balms on your incision unless instructed to specifically by Dr. Henreitta LeberBridges.   Medication: Take tylenol and ibuprofen as needed for pain control, alternating every 4-6 hours.  Example:  Tylenol 1000mg  @ 6am, 12noon, 6pm, 12midnight (Do not exceed 4000mg  of tylenol a day). Ibuprofen 800mg  @ 9am, 3pm, 9pm, 3am (Do not exceed 3600mg  of ibuprofen a day).  Take Roxicodone for breakthrough pain every 4 hours.  Take Colace for constipation related to narcotic pain medication. If you do not have a bowel movement in 2 days, take Miralax over the counter.  Drink plenty of water to also prevent constipation.   Contact  Information: If you have questions or concerns, please call our office, [phone], Monday- Thursday 8AM-5PM and Friday 8AM-12Noon.  If it is after hours or on the weekend, please call [facility], [phone], and ask to speak to the surgeon on call for Dr. Henreitta [facility]berBridges at [facility].    Laparoscopic Cholecystectomy, Care After This sheet gives you information about how to care for yourself after your procedure. Your health care provider may also give you more specific instructions. If you have problems or questions, contact your health care provider. What can I expect after the procedure? After the procedure, it is common to have:  Pain at your incision sites. You will be given medicines to control this pain.  Mild nausea or vomiting.  Bloating and possible shoulder pain from the air-like gas that was used during the procedure. Follow these instructions at home: Incision care   Follow instructions from your health care provider about how to take care of your incisions. Make sure you: ? Wash your hands with soap and water before you change your bandage (dressing). If soap and water are not available, use hand sanitizer. ? Change your dressing as told by your health care provider. ? Leave stitches (sutures), skin glue, or adhesive strips in place. These skin closures may need to be in place for 2 weeks or longer. If adhesive strip edges start to loosen and curl up, you may trim the loose edges. Do not remove  adhesive strips completely unless your health care provider tells you to do that.  Do not take baths, swim, or use a hot tub until your health care provider approves.   You may shower.  Check your incision area every day for signs of infection. Check for: ? More redness, swelling, or pain. ? More fluid or blood. ? Warmth. ? Pus or a bad smell. Activity  Do not drive or use heavy machinery while taking prescription pain medicine.  Do not lift anything that is heavier than  10 lb (4.5 kg) until your health care provider approves.  Do not play contact sports until your health care provider approves.  Do not drive for 24 hours if you were given a medicine to help you relax (sedative).  Rest as needed. Do not return to work or school until your health care provider approves. General instructions  Take over-the-counter and prescription medicines only as told by your health care provider.  To prevent or treat constipation while you are taking prescription pain medicine, your health care provider may recommend that you: ? Drink enough fluid to keep your urine clear or pale yellow. ? Take over-the-counter or prescription medicines. ? Eat foods that are high in fiber, such as fresh fruits and vegetables, whole grains, and beans. ? Limit foods that are high in fat and processed sugars, such as fried and sweet foods. Contact a health care provider if:  You develop a rash.  You have more redness, swelling, or pain around your incisions.  You have more fluid or blood coming from your incisions.  Your incisions feel warm to the touch.  You have pus or a bad smell coming from your incisions.  You have a fever.  One or more of your incisions breaks open. Get help right away if:  You have trouble breathing.  You have chest pain.  You have increasing pain in your shoulders.  You faint or feel dizzy when you stand.  You have severe pain in your abdomen.  You have nausea or vomiting that lasts for more than one day.  You have leg pain. This information is not intended to replace advice given to you by your health care provider. Make sure you discuss any questions you have with your health care provider. Document Released: 06/11/2005 Document Revised: 12/31/2015 Document Reviewed: 11/28/2015 Elsevier Interactive Patient Education  2019 [employer].

## 2018-11-27 NOTE — Anesthesia Procedure Notes (Signed)
Procedure Name: Intubation Date/Time: 11/27/2018 7:39 AM Performed by: Franco Nones, CRNA Pre-anesthesia Checklist: Patient identified, Patient being monitored, Timeout performed, Emergency Drugs available and Suction available Patient Re-evaluated:Patient Re-evaluated prior to induction Oxygen Delivery Method: Circle System Utilized Preoxygenation: Pre-oxygenation with 100% oxygen Induction Type: IV induction Laryngoscope Size: Miller and 2 Grade View: Grade I Tube type: Oral Tube size: 7.0 mm Number of attempts: 1 Airway Equipment and Method: Stylet and Oral airway Placement Confirmation: ETT inserted through vocal cords under direct vision,  positive ETCO2 and breath sounds checked- equal and bilateral Secured at: 22 cm Tube secured with: Tape Dental Injury: Teeth and Oropharynx as per pre-operative assessment

## 2018-11-27 NOTE — Progress Notes (Signed)
[facility]  Patient seen this afternoon. Is feeling much better. Has eaten and drank. Has walked to the restroom. Did pretty well.    Patient and family wanting her to go home. This should be fine. Dr. Karilyn Cota is going to get her ERCP scheduled.  Discussed with Dr. Marland Mcalpine.  Algis Greenhouse, MD [facility] [address] [place], [place] [address] [phone] (office)

## 2018-11-28 ENCOUNTER — Encounter (HOSPITAL_COMMUNITY): Payer: Self-pay | Admitting: General Surgery

## 2018-11-29 ENCOUNTER — Telehealth: Payer: Self-pay | Admitting: General Surgery

## 2018-11-29 NOTE — Telephone Encounter (Signed)
[facility]  Pathology: Diagnosis Gallbladder - ACUTE AND CHRONIC CHOLECYSTITIS WITH NECROSIS AND CHOLELITHIASIS  Called to check on patient. She is still sore but doing ok. Tolerating liquids and passing gas.  Dr. Laural Golden is going to schedule ERCP follow up for next week.  Curlene Labrum, MD [facility] [address] [place], [place] [address] [phone] (office)

## 2018-11-30 ENCOUNTER — Inpatient Hospital Stay (HOSPITAL_COMMUNITY)
Admission: EM | Admit: 2018-11-30 | Discharge: 2018-12-03 | DRG: 291 | Disposition: A | Discharge status: Home / Self Care | Payer: Medicare Other | Attending: Family Medicine | Admitting: Family Medicine

## 2018-11-30 ENCOUNTER — Emergency Department (HOSPITAL_COMMUNITY): Payer: Medicare Other

## 2018-11-30 ENCOUNTER — Other Ambulatory Visit: Payer: Self-pay

## 2018-11-30 ENCOUNTER — Encounter (HOSPITAL_COMMUNITY): Payer: Self-pay | Admitting: *Deleted

## 2018-11-30 ENCOUNTER — Telehealth: Payer: Self-pay | Admitting: General Surgery

## 2018-11-30 ENCOUNTER — Encounter: Payer: Self-pay | Admitting: General Surgery

## 2018-11-30 DIAGNOSIS — I110 Hypertensive heart disease with heart failure: Secondary | ICD-10-CM | POA: Diagnosis not present

## 2018-11-30 DIAGNOSIS — I5033 Acute on chronic diastolic (congestive) heart failure: Secondary | ICD-10-CM | POA: Diagnosis present

## 2018-11-30 DIAGNOSIS — K8050 Calculus of bile duct without cholangitis or cholecystitis without obstruction: Secondary | ICD-10-CM | POA: Diagnosis present

## 2018-11-30 DIAGNOSIS — R778 Other specified abnormalities of plasma proteins: Secondary | ICD-10-CM

## 2018-11-30 DIAGNOSIS — R7989 Other specified abnormal findings of blood chemistry: Secondary | ICD-10-CM | POA: Diagnosis not present

## 2018-11-30 DIAGNOSIS — R7303 Prediabetes: Secondary | ICD-10-CM | POA: Diagnosis present

## 2018-11-30 DIAGNOSIS — I10 Essential (primary) hypertension: Secondary | ICD-10-CM | POA: Diagnosis present

## 2018-11-30 DIAGNOSIS — I083 Combined rheumatic disorders of mitral, aortic and tricuspid valves: Secondary | ICD-10-CM | POA: Diagnosis present

## 2018-11-30 DIAGNOSIS — Z20828 Contact with and (suspected) exposure to other viral communicable diseases: Secondary | ICD-10-CM | POA: Diagnosis present

## 2018-11-30 DIAGNOSIS — I2510 Atherosclerotic heart disease of native coronary artery without angina pectoris: Secondary | ICD-10-CM | POA: Diagnosis present

## 2018-11-30 DIAGNOSIS — R0902 Hypoxemia: Secondary | ICD-10-CM | POA: Diagnosis not present

## 2018-11-30 DIAGNOSIS — I5031 Acute diastolic (congestive) heart failure: Secondary | ICD-10-CM | POA: Diagnosis not present

## 2018-11-30 DIAGNOSIS — R932 Abnormal findings on diagnostic imaging of liver and biliary tract: Secondary | ICD-10-CM | POA: Diagnosis not present

## 2018-11-30 DIAGNOSIS — Z9851 Tubal ligation status: Secondary | ICD-10-CM | POA: Diagnosis not present

## 2018-11-30 DIAGNOSIS — J9601 Acute respiratory failure with hypoxia: Secondary | ICD-10-CM | POA: Diagnosis present

## 2018-11-30 DIAGNOSIS — R0602 Shortness of breath: Secondary | ICD-10-CM | POA: Diagnosis not present

## 2018-11-30 DIAGNOSIS — R945 Abnormal results of liver function studies: Secondary | ICD-10-CM | POA: Diagnosis not present

## 2018-11-30 DIAGNOSIS — Z87891 Personal history of nicotine dependence: Secondary | ICD-10-CM

## 2018-11-30 DIAGNOSIS — J449 Chronic obstructive pulmonary disease, unspecified: Secondary | ICD-10-CM | POA: Diagnosis present

## 2018-11-30 DIAGNOSIS — T8182XA Emphysema (subcutaneous) resulting from a procedure, initial encounter: Secondary | ICD-10-CM | POA: Diagnosis present

## 2018-11-30 DIAGNOSIS — Z8249 Family history of ischemic heart disease and other diseases of the circulatory system: Secondary | ICD-10-CM | POA: Diagnosis not present

## 2018-11-30 DIAGNOSIS — E538 Deficiency of other specified B group vitamins: Secondary | ICD-10-CM | POA: Diagnosis present

## 2018-11-30 DIAGNOSIS — Z79899 Other long term (current) drug therapy: Secondary | ICD-10-CM

## 2018-11-30 DIAGNOSIS — E785 Hyperlipidemia, unspecified: Secondary | ICD-10-CM | POA: Diagnosis present

## 2018-11-30 DIAGNOSIS — I351 Nonrheumatic aortic (valve) insufficiency: Secondary | ICD-10-CM | POA: Diagnosis not present

## 2018-11-30 DIAGNOSIS — E876 Hypokalemia: Secondary | ICD-10-CM | POA: Diagnosis not present

## 2018-11-30 DIAGNOSIS — Z79890 Hormone replacement therapy (postmenopausal): Secondary | ICD-10-CM

## 2018-11-30 DIAGNOSIS — R933 Abnormal findings on diagnostic imaging of other parts of digestive tract: Secondary | ICD-10-CM | POA: Diagnosis not present

## 2018-11-30 DIAGNOSIS — D649 Anemia, unspecified: Secondary | ICD-10-CM | POA: Diagnosis present

## 2018-11-30 DIAGNOSIS — R0600 Dyspnea, unspecified: Secondary | ICD-10-CM | POA: Diagnosis not present

## 2018-11-30 DIAGNOSIS — E039 Hypothyroidism, unspecified: Secondary | ICD-10-CM | POA: Diagnosis present

## 2018-11-30 DIAGNOSIS — J90 Pleural effusion, not elsewhere classified: Secondary | ICD-10-CM | POA: Diagnosis not present

## 2018-11-30 DIAGNOSIS — Z9049 Acquired absence of other specified parts of digestive tract: Secondary | ICD-10-CM

## 2018-11-30 DIAGNOSIS — I340 Nonrheumatic mitral (valve) insufficiency: Secondary | ICD-10-CM | POA: Diagnosis not present

## 2018-11-30 HISTORY — DX: Hyperlipidemia, unspecified: E78.5

## 2018-11-30 LAB — CBC WITH DIFFERENTIAL/PLATELET
Abs Immature Granulocytes: 0.41 10*3/uL — ABNORMAL HIGH (ref 0.00–0.07)
Basophils Absolute: 0.1 10*3/uL (ref 0.0–0.1)
Basophils Relative: 1 %
Eosinophils Absolute: 0.1 10*3/uL (ref 0.0–0.5)
Eosinophils Relative: 2 %
HCT: 38.9 % (ref 36.0–46.0)
Hemoglobin: 12.4 g/dL (ref 12.0–15.0)
Immature Granulocytes: 5 %
Lymphocytes Relative: 25 %
Lymphs Abs: 2.2 10*3/uL (ref 0.7–4.0)
MCH: 27.1 pg (ref 26.0–34.0)
MCHC: 31.9 g/dL (ref 30.0–36.0)
MCV: 85.1 fL (ref 80.0–100.0)
Monocytes Absolute: 1 10*3/uL (ref 0.1–1.0)
Monocytes Relative: 12 %
Neutro Abs: 4.9 10*3/uL (ref 1.7–7.7)
Neutrophils Relative %: 55 %
Platelets: 389 10*3/uL (ref 150–400)
RBC: 4.57 MIL/uL (ref 3.87–5.11)
RDW: 14.6 % (ref 11.5–15.5)
WBC: 8.8 10*3/uL (ref 4.0–10.5)
nRBC: 0 % (ref 0.0–0.2)

## 2018-11-30 LAB — COMPREHENSIVE METABOLIC PANEL
ALT: 116 U/L — ABNORMAL HIGH (ref 0–44)
AST: 136 U/L — ABNORMAL HIGH (ref 15–41)
Albumin: 2.7 g/dL — ABNORMAL LOW (ref 3.5–5.0)
Alkaline Phosphatase: 425 U/L — ABNORMAL HIGH (ref 38–126)
Anion gap: 14 (ref 5–15)
BUN: 8 mg/dL (ref 8–23)
CO2: 23 mmol/L (ref 22–32)
Calcium: 8.9 mg/dL (ref 8.9–10.3)
Chloride: 102 mmol/L (ref 98–111)
Creatinine, Ser: 0.72 mg/dL (ref 0.44–1.00)
GFR calc Af Amer: >60 mL/min (ref >60)
GFR calc non Af Amer: >60 mL/min (ref >60)
Glucose, Bld: 127 mg/dL — ABNORMAL HIGH (ref 70–99)
Potassium: 3.7 mmol/L (ref 3.5–5.1)
Sodium: 139 mmol/L (ref 135–145)
Total Bilirubin: 0.8 mg/dL (ref 0.3–1.2)
Total Protein: 6.5 g/dL (ref 6.5–8.1)

## 2018-11-30 LAB — SARS CORONAVIRUS 2 BY RT PCR (HOSPITAL ORDER, PERFORMED IN ~~LOC~~ HOSPITAL LAB): SARS Coronavirus 2: NEGATIVE

## 2018-11-30 LAB — TROPONIN I
Troponin I: 0.05 ng/mL (ref <0.03)
Troponin I: 0.05 ng/mL (ref <0.03)

## 2018-11-30 LAB — BRAIN NATRIURETIC PEPTIDE: B Natriuretic Peptide: 228 pg/mL — ABNORMAL HIGH (ref 0.0–100.0)

## 2018-11-30 LAB — D-DIMER, QUANTITATIVE: D-Dimer, Quant: 4.79 ug/mL-FEU — ABNORMAL HIGH (ref 0.00–0.50)

## 2018-11-30 MED ORDER — DOCUSATE SODIUM 100 MG PO CAPS
100.0000 mg | ORAL_CAPSULE | Freq: Two times a day (BID) | ORAL | Status: DC
  Administered 2018-12-01 – 2018-12-03 (×5): 100 mg via ORAL
  Filled 2018-11-30 (×5): qty 1

## 2018-11-30 MED ORDER — ACETAMINOPHEN 650 MG RE SUPP: 650.0000 mg | Freq: Four times a day (QID) | RECTAL | Status: DC | PRN

## 2018-11-30 MED ORDER — ACETAMINOPHEN 325 MG PO TABS
650.0000 mg | ORAL_TABLET | Freq: Four times a day (QID) | ORAL | Status: DC | PRN
  Administered 2018-12-02: 650 mg via ORAL
  Filled 2018-11-30: qty 2

## 2018-11-30 MED ORDER — SODIUM CHLORIDE 0.9% FLUSH
3.0000 mL | Freq: Two times a day (BID) | INTRAVENOUS | Status: DC
  Administered 2018-12-01 – 2018-12-03 (×5): 3 mL via INTRAVENOUS

## 2018-11-30 MED ORDER — SODIUM CHLORIDE 0.9% FLUSH: 3.0000 mL | INTRAVENOUS | Status: DC | PRN

## 2018-11-30 MED ORDER — IOHEXOL 350 MG/ML SOLN
75.0000 mL | Freq: Once | INTRAVENOUS | Status: AC | PRN
  Administered 2018-11-30: 18:00:00 via INTRAVENOUS

## 2018-11-30 MED ORDER — ONDANSETRON HCL 4 MG/2ML IJ SOLN: 4.0000 mg | Freq: Four times a day (QID) | INTRAMUSCULAR | Status: DC | PRN

## 2018-11-30 MED ORDER — ENOXAPARIN SODIUM 40 MG/0.4ML ~~LOC~~ SOLN
40.0000 mg | SUBCUTANEOUS | Status: DC
  Administered 2018-12-01: 40 mg via SUBCUTANEOUS
  Filled 2018-11-30: qty 0.4

## 2018-11-30 MED ORDER — SODIUM CHLORIDE 0.9 % IV SOLN: 250.0000 mL | INTRAVENOUS | Status: DC | PRN

## 2018-11-30 MED ORDER — NICOTINE 21 MG/24HR TD PT24
21.0000 mg | MEDICATED_PATCH | Freq: Every day | TRANSDERMAL | Status: DC
  Administered 2018-12-03: 21 mg via TRANSDERMAL
  Filled 2018-11-30 (×3): qty 1

## 2018-11-30 MED ORDER — LEVOTHYROXINE SODIUM 88 MCG PO TABS
88.0000 ug | ORAL_TABLET | Freq: Every day | ORAL | Status: DC
  Administered 2018-12-01 – 2018-12-03 (×2): 88 ug via ORAL
  Filled 2018-11-30 (×2): qty 1

## 2018-11-30 MED ORDER — OXYCODONE HCL 5 MG PO TABS
5.0000 mg | ORAL_TABLET | ORAL | Status: DC | PRN
  Administered 2018-12-01: 5 mg via ORAL
  Filled 2018-11-30: qty 1

## 2018-11-30 NOTE — Progress Notes (Signed)
Patient with desaturations per family with home pulse ox. Work up with positive troponin to 0.05 and atelectasis. No leukocytosis, t bili normal but some lft elevation likely post op. Being admitted to trend troponin. Updated daughter Nevin Bloodgood. Appreciate Hospitalist assistance. Npo at midnight pending repeat am labs/ ercp possibly tomorrow. Dr. Laural Golden made aware of patient.  Curlene Labrum, MD

## 2018-11-30 NOTE — ED Provider Notes (Signed)
[facility] EMERGENCY DEPARTMENT Provider Note   CSN: [id number] Arrival date & time: 11/30/18  1539    History   Chief Complaint Chief Complaint  Patient presents with   Shortness of Breath    HPI Sherry Bridges is a 78 y.o. female.     Patient presented with mild shortness of breath.  She recently had her gallbladder removed and a stent placed in her pancreatic duct.  Patient has had minimal right upper quadrant pain.  The history is provided by the patient. No language interpreter was used.  Shortness of Breath  Severity:  Mild Onset quality:  Sudden Timing:  Constant Progression:  Improving Chronicity:  New Context: activity   Relieved by:  Nothing Worsened by:  Nothing Ineffective treatments:  None tried Associated symptoms: abdominal pain   Associated symptoms: no chest pain, no cough, no headaches and no rash   Risk factors: no recent alcohol use     Past Medical History:  Diagnosis Date   Hypothyroidism 01/02/2017    Patient Active Problem List   Diagnosis Date Noted   Acute cholecystitis 11/25/2018   Essential hypertension 11/25/2018   Hyperlipidemia 11/25/2018   Nausea and vomiting 11/25/2018   Acute cholecystitis due to biliary calculus 11/25/2018   Hypothyroidism 01/02/2017    Past Surgical History:  Procedure Laterality Date   CHOLECYSTECTOMY N/A 11/27/2018   Procedure: LAPAROSCOPIC CHOLECYSTECTOMY;  Surgeon: Lucretia RoersBridges, Lindsay C, MD;  Location: [facility];  Service: General;  Laterality: N/A;   TUBAL LIGATION       OB History   No obstetric history on file.      Home Medications    Prior to Admission medications   Medication Sig Start Date End Date Taking? Authorizing Provider  atorvastatin (LIPITOR) 40 MG tablet Take 40 mg by mouth daily.    [provider]  co-enzyme Q-10 30 MG capsule Take 30 mg by mouth daily.     [provider]  docusate sodium (COLACE) 100 MG capsule Take 1 capsule (100 mg total) by  mouth 2 (two) times daily. 11/27/18 11/27/19  Lucretia RoersBridges, Lindsay C, MD  EUTHYROX 88 MCG tablet Take 88 mcg by mouth daily. 11/19/18   [provider]  Multiple Vitamin (MULTIVITAMIN) tablet Take 1 tablet by mouth daily.    [provider]  Omega-3 Fatty Acids (FISH OIL) 1000 MG CAPS Take 1 capsule by mouth daily.     [provider]  ondansetron (ZOFRAN) 4 MG tablet Take 1 tablet (4 mg total) by mouth daily as needed for nausea or vomiting. 11/27/18 11/27/19  Lucretia RoersBridges, Lindsay C, MD  oxyCODONE (OXY IR/ROXICODONE) 5 MG immediate release tablet Take 1 tablet (5 mg total) by mouth every 4 (four) hours as needed for moderate pain. 11/27/18   Lucretia RoersBridges, Lindsay C, MD    Family History No family history on file.  Social History Social History   Tobacco Use   Smoking status: Former Smoker   Smokeless tobacco: Never Used  Substance Use Topics   Alcohol use: Yes    Comment: OCCASIONAL   Drug use: No     Allergies   Patient has no known allergies.   Review of Systems Review of Systems  Constitutional: Negative for appetite change and fatigue.  HENT: Negative for congestion, ear discharge and sinus pressure.   Eyes: Negative for discharge.  Respiratory: Positive for shortness of breath. Negative for cough.   Cardiovascular: Negative for chest pain.  Gastrointestinal: Positive for abdominal pain. Negative for diarrhea.  Genitourinary: Negative for frequency and hematuria.  Musculoskeletal: Negative for back pain.  Skin: Negative for rash.  Neurological: Negative for seizures and headaches.  Psychiatric/Behavioral: Negative for hallucinations.     Physical Exam Updated Vital Signs BP (!) 143/65    Pulse 73    Temp 98 F (36.7 C) (Oral)    Resp (!) 22    Ht 5\' 4"  (1.626 m)    Wt 67.6 kg    SpO2 95%    BMI 25.58 kg/m   Physical Exam Vitals signs and nursing note reviewed.  Constitutional:      Appearance: She is well-developed.  HENT:     Head: Normocephalic.      Nose: Nose normal.  Eyes:     General: No scleral icterus.    Conjunctiva/sclera: Conjunctivae normal.  Neck:     Musculoskeletal: Neck supple.     Thyroid: No thyromegaly.  Cardiovascular:     Rate and Rhythm: Normal rate and regular rhythm.     Heart sounds: No murmur. No friction rub. No gallop.   Pulmonary:     Breath sounds: No stridor. No wheezing or rales.  Chest:     Chest wall: No tenderness.  Abdominal:     General: There is no distension.     Tenderness: There is no abdominal tenderness. There is no rebound.  Musculoskeletal: Normal range of motion.  Lymphadenopathy:     Cervical: No cervical adenopathy.  Skin:    Findings: No erythema or rash.  Neurological:     Mental Status: She is alert and oriented to person, place, and time.     Motor: No abnormal muscle tone.     Coordination: Coordination normal.  Psychiatric:        Behavior: Behavior normal.      ED Treatments / Results  Labs (all labs ordered are listed, but only abnormal results are displayed) Labs Reviewed  CBC WITH DIFFERENTIAL/PLATELET - Abnormal; Notable for the following components:      Result Value   Abs Immature Granulocytes 0.41 (*)    All other components within normal limits  COMPREHENSIVE METABOLIC PANEL - Abnormal; Notable for the following components:   Glucose, Bld 127 (*)    Albumin 2.7 (*)    AST 136 (*)    ALT 116 (*)    Alkaline Phosphatase 425 (*)    All other components within normal limits  TROPONIN I - Abnormal; Notable for the following components:   Troponin I 0.05 (*)    All other components within normal limits  D-DIMER, QUANTITATIVE (NOT AT [facility]) - Abnormal; Notable for the following components:   D-Dimer, Quant 4.79 (*)    All other components within normal limits  SARS CORONAVIRUS 2 (HOSPITAL ORDER, PERFORMED IN [facility])    EKG EKG Interpretation  Date/Time:  Sunday November 30 2018 16:11:12 EDT Ventricular Rate:  74 PR Interval:    QRS  Duration: 90 QT Interval:  410 QTC Calculation: 455 R Axis:   31 Text Interpretation:  Sinus rhythm Consider left atrial enlargement Low voltage, precordial leads Probable anteroseptal infarct, old Confirmed by Bethann BerkshireZammit, Alfie Alderfer [phone]([id number]) on 11/30/2018 5:17:37 PM Also confirmed by Bethann BerkshireZammit, Kearie Mennen [phone]([id number])  on 11/30/2018 7:19:17 PM   Radiology Dg Chest 2 View  Result Date: 11/30/2018 CLINICAL DATA:  Shortness of breath and hypoxia. EXAM: CHEST - 2 VIEW COMPARISON:  None. FINDINGS: Lungs are adequately inflated demonstrate small bilateral pleural effusions likely with associated bibasilar atelectasis. Minimal prominence of  the perihilar vessels suggesting mild degree of vascular congestion. Cardiomediastinal silhouette is unremarkable. There are degenerative changes of the spine. IMPRESSION: Suggestion of mild vascular congestion. Small bilateral pleural effusions with associated bibasilar atelectasis. Infection in the lung bases is possible. Electronically Signed   By: Marin Olp M.D.   On: 11/30/2018 17:02   Ct Angio Chest Pe W And/or Wo Contrast  Result Date: 11/30/2018 CLINICAL DATA:  Hypoxia, recent cholecystectomy EXAM: CT ANGIOGRAPHY CHEST WITH CONTRAST TECHNIQUE: Multidetector CT imaging of the chest was performed using the standard protocol during bolus administration of intravenous contrast. Multiplanar CT image reconstructions and MIPs were obtained to evaluate the vascular anatomy. CONTRAST:  <See Chart> OMNIPAQUE IOHEXOL 350 MG/ML SOLN COMPARISON:  Chest radiograph, 11/30/2018 FINDINGS: Cardiovascular: Satisfactory opacification of the pulmonary arteries to the segmental level. No evidence of pulmonary embolism. Aortic atherosclerosis. Normal heart size. Three-vessel coronary artery calcifications. No pericardial effusion. Mediastinum/Nodes: No enlarged mediastinal, hilar, or axillary lymph nodes. Thyroid gland, trachea, and esophagus demonstrate no significant findings. There is a fat containing  left-sided Bochdalek's type hernia. Lungs/Pleura: Small bilateral pleural effusions, right greater than left, with associated atelectasis or consolidation. Underlying moderate centrilobular emphysema. Upper Abdomen: No acute abnormality. Musculoskeletal: Scattered subcutaneous emphysema about the right chest wall, likely related to recent laparoscopic surgery. No acute or significant osseous findings. Review of the MIP images confirms the above findings. IMPRESSION: 1.  Negative examination for pulmonary embolism. 2. Small, right greater than left pleural effusions with associated atelectasis or consolidation. 3.  Emphysema. 4.  Coronary artery disease. 5. Scattered subcutaneous emphysema about the right chest wall, likely related to recent laparoscopic surgery. Electronically Signed   By: Eddie Candle M.D.   On: 11/30/2018 19:05    Procedures Procedures (including critical care time)  Medications Ordered in ED Medications  iohexol (OMNIPAQUE) 350 MG/ML injection 75 mL ( Intravenous Contrast Given 11/30/18 1825)     Initial Impression / Assessment and Plan / ED Course  I have reviewed the triage vital signs and the nursing notes.  Pertinent labs & imaging results that were available during my care of the patient were reviewed by me and considered in my medical decision making (see chart for details).   Patient with mild elevation of troponin.  CT angios shows no PE.  I spoke with general surgery and it has been decided to have medicine admit her follow her troponins and consult GI tomorrow for possible ERCP which was planned this week      Final Clinical Impressions(s) / ED Diagnoses   Final diagnoses:  SOB (shortness of breath)    ED Discharge Orders    None       Milton Ferguson, MD 11/30/18 1954

## 2018-11-30 NOTE — ED Notes (Signed)
Patients daughter called at this time was informed of patient care plan. Daughter informed that she may speak with RN at this time. Daughter declined.

## 2018-11-30 NOTE — ED Triage Notes (Addendum)
Pt c/o low oxygen levels falling into the lower 80's at home with SOB. Pt had cholecystectomy on Thursday last week. Pt does not wear home O2.

## 2018-11-30 NOTE — Telephone Encounter (Signed)
[facility]  Patient's daughter is an NP, she called and notified me that patient having desaturations to 85% and this improves with IS to 93%. She has been smoking less but still smoking. No other complaints. Patient was in the hospital several days and had ERCP and Lap chole. She is at risk for PNA, PE, etc.  Will need to have her come in to be evaluated and get CXR, Labs, potentially CTA.   Will notify the ED. Daughter understanding. Will update Dr. Laural Golden.  Curlene Labrum, MD [facility] [address] [place], [place] [address] [phone] (office)

## 2018-11-30 NOTE — ED Notes (Signed)
ED TO INPATIENT HANDOFF REPORT  ED Nurse Name and Phone #: Tylicia Sherman,rn [phone]  S Name/Age/Gender Sherry Bridges 78 y.o. female Room/Bed: APA09/APA09  Code Status   Code Status: Full Code  Home/SNF/Other Home Patient oriented to: self, place, time and situation Is this baseline? Yes   Triage Complete: Triage complete  Chief Complaint Shortness of Breath  Triage Note Pt c/o low oxygen levels falling into the lower 80's at home with SOB. Pt had cholecystectomy on Thursday last week. Pt does not wear home O2.   Allergies No Known Allergies  Level of Care/Admitting Diagnosis ED Disposition    ED Disposition Condition Comment   Admit  Hospital Area: [facility] [[id number]]  Level of Care: Telemetry [5]  Covid Evaluation: Confirmed COVID Negative  Diagnosis: Dyspnea [454098][241871]  Admitting Physician: Pearson GrippeKIM, JAMES [[id number]]  Attending Physician: Pearson GrippeKIM, JAMES [[id number]]  PT Class (Do Not Modify): Observation [104]  PT Acc Code (Do Not Modify): Observation [10022]       B Medical/Surgery History Past Medical History:  Diagnosis Date  . Hypothyroidism 01/02/2017   Past Surgical History:  Procedure Laterality Date  . CHOLECYSTECTOMY N/A 11/27/2018   Procedure: LAPAROSCOPIC CHOLECYSTECTOMY;  Surgeon: Lucretia RoersBridges, Lindsay C, MD;  Location: [facility];  Service: General;  Laterality: N/A;  . TUBAL LIGATION       A IV Location/Drains/Wounds Patient Lines/Drains/Airways Status   Active Line/Drains/Airways    Name:   Placement date:   Placement time:   Site:   Days:   Peripheral IV 11/27/18 Right Hand   11/27/18    0653    Hand   3   Peripheral IV 11/30/18 Right Antecubital   11/30/18    1855    Antecubital   less than 1   GI Stent 4 Fr.   11/26/18    1309    -   4   Incision (Closed) 11/27/18 Abdomen Other (Comment)   11/27/18    0748     3   Incision - 4 Ports Abdomen 1: Umbilicus 2: Mid;Upper 3: Right;Lateral;Lower 4: Lower;Mid   11/27/18    0751     3          Intake/Output  Last 24 hours No intake or output data in the 24 hours ending 11/30/18 2022  Labs/Imaging Results for orders placed or performed during the hospital encounter of 11/30/18 (from the past 48 hour(s))  CBC with Differential/Platelet     Status: Abnormal   Collection Time: 11/30/18  4:25 PM  Result Value Ref Range   WBC 8.8 4.0 - 10.5 K/uL   RBC 4.57 3.87 - 5.11 MIL/uL   Hemoglobin 12.4 12.0 - 15.0 g/dL   HCT 11.938.9 14.736.0 - 82.946.0 %   MCV 85.1 80.0 - 100.0 fL   MCH 27.1 26.0 - 34.0 pg   MCHC 31.9 30.0 - 36.0 g/dL   RDW 56.214.6 13.011.5 - 86.515.5 %   Platelets 389 150 - 400 K/uL   nRBC 0.0 0.0 - 0.2 %   Neutrophils Relative % 55 %   Neutro Abs 4.9 1.7 - 7.7 K/uL   Lymphocytes Relative 25 %   Lymphs Abs 2.2 0.7 - 4.0 K/uL   Monocytes Relative 12 %   Monocytes Absolute 1.0 0.1 - 1.0 K/uL   Eosinophils Relative 2 %   Eosinophils Absolute 0.1 0.0 - 0.5 K/uL   Basophils Relative 1 %   Basophils Absolute 0.1 0.0 - 0.1 K/uL   Immature Granulocytes 5 %  Abs Immature Granulocytes 0.41 (H) 0.00 - 0.07 K/uL    Comment: Performed at [facility], [address] [place], [place] [address]  Comprehensive metabolic panel     Status: Abnormal   Collection Time: 11/30/18  4:25 PM  Result Value Ref Range   Sodium 139 135 - 145 mmol/L   Potassium 3.7 3.5 - 5.1 mmol/L   Chloride 102 98 - 111 mmol/L   CO2 23 22 - 32 mmol/L   Glucose, Bld 127 (H) 70 - 99 mg/dL   BUN 8 8 - 23 mg/dL   Creatinine, Ser 6.040.72 0.44 - 1.00 mg/dL   Calcium 8.9 8.9 - 54.010.3 mg/dL   Total Protein 6.5 6.5 - 8.1 g/dL   Albumin 2.7 (L) 3.5 - 5.0 g/dL   AST 981136 (H) 15 - 41 U/L   ALT 116 (H) 0 - 44 U/L   Alkaline Phosphatase 425 (H) 38 - 126 U/L   Total Bilirubin 0.8 0.3 - 1.2 mg/dL   GFR calc non Af Amer >60 >60 mL/min   GFR calc Af Amer >60 >60 mL/min   Anion gap 14 5 - 15    Comment: Performed at [facility], [address] [place], [place] [address]  Troponin I - ONCE - STAT     Status: Abnormal   Collection Time: 11/30/18  4:25 PM   Result Value Ref Range   Troponin I 0.05 (HH) <0.03 ng/mL    Comment: CRITICAL RESULT CALLED TO, READ BACK BY AND VERIFIED WITH: VOGLER,T@[id number]  BY MATTHEWS,B 6.7.2020 Performed at [facility], [address] [place], [place] [address]   D-dimer, quantitative (not at [facility])     Status: Abnormal   Collection Time: 11/30/18  4:25 PM  Result Value Ref Range   D-Dimer, Quant 4.79 (H) 0.00 - 0.50 ug/mL-FEU    Comment: (NOTE) At the manufacturer cut-off of 0.50 ug/mL FEU, this assay has been documented to exclude PE with a sensitivity and negative predictive value of 97 to 99%.  At this time, this assay has not been approved by the FDA to exclude DVT/VTE. Results should be correlated with clinical presentation. Performed at [facility], [address] [place], [place] [address]   SARS Coronavirus 2 (CEPHEID- Performed in [facility] hospital lab), Hosp Order     Status: None   Collection Time: 11/30/18  6:24 PM  Result Value Ref Range   SARS Coronavirus 2 NEGATIVE NEGATIVE    Comment: (NOTE) If result is NEGATIVE SARS-CoV-2 target nucleic acids are NOT DETECTED. The SARS-CoV-2 RNA is generally detectable in upper and lower  respiratory specimens during the acute phase of infection. The lowest  concentration of SARS-CoV-2 viral copies this assay can detect is 250  copies / mL. A negative result does not preclude SARS-CoV-2 infection  and should not be used as the sole basis for treatment or other  patient management decisions.  A negative result may occur with  improper specimen collection / handling, submission of specimen other  than nasopharyngeal swab, presence of viral mutation(s) within the  areas targeted by this assay, and inadequate number of viral copies  (<250 copies / mL). A negative result must be combined with clinical  observations, patient history, and epidemiological information. If result is POSITIVE SARS-CoV-2 target nucleic acids are DETECTED. The SARS-CoV-2 RNA is  generally detectable in upper and lower  respiratory specimens dur ing the acute phase of infection.  Positive  results are indicative of active infection with SARS-CoV-2.  Clinical  correlation with patient history and other diagnostic information is  necessary to determine patient infection status.  Positive results do  not rule out bacterial infection or co-infection with other viruses. If result is PRESUMPTIVE POSTIVE SARS-CoV-2 nucleic acids MAY BE PRESENT.   A presumptive positive result was obtained on the submitted specimen  and confirmed on repeat testing.  While 2019 novel coronavirus  (SARS-CoV-2) nucleic acids may be present in the submitted sample  additional confirmatory testing may be necessary for epidemiological  and / or clinical management purposes  to differentiate between  SARS-CoV-2 and other Sarbecovirus currently known to infect humans.  If clinically indicated additional testing with an alternate test  methodology [phone]) is advised. The SARS-CoV-2 RNA is generally  detectable in upper and lower respiratory sp ecimens during the acute  phase of infection. The expected result is Negative. Fact Sheet for Patients:  StrictlyIdeas.no Fact Sheet for Healthcare Providers: BankingDealers.co.za This test is not yet approved or cleared by the [place] FDA and has been authorized for detection and/or diagnosis of SARS-CoV-2 by FDA under an Emergency Use Authorization (EUA).  This EUA will remain in effect (meaning this test can be used) for the duration of the COVID-19 declaration under Section 564(b)(1) of the Act, 21 U.S.C. section 360bbb-3(b)(1), unless the authorization is terminated or revoked sooner. Performed at [facility], [address] [place], [place] [address]    Dg Chest 2 View  Result Date: 11/30/2018 CLINICAL DATA:  Shortness of breath and hypoxia. EXAM: CHEST - 2 VIEW COMPARISON:  None. FINDINGS:  Lungs are adequately inflated demonstrate small bilateral pleural effusions likely with associated bibasilar atelectasis. Minimal prominence of the perihilar vessels suggesting mild degree of vascular congestion. Cardiomediastinal silhouette is unremarkable. There are degenerative changes of the spine. IMPRESSION: Suggestion of mild vascular congestion. Small bilateral pleural effusions with associated bibasilar atelectasis. Infection in the lung bases is possible. Electronically Signed   By: Marin Olp M.D.   On: 11/30/2018 17:02   Ct Angio Chest Pe W And/or Wo Contrast  Result Date: 11/30/2018 CLINICAL DATA:  Hypoxia, recent cholecystectomy EXAM: CT ANGIOGRAPHY CHEST WITH CONTRAST TECHNIQUE: Multidetector CT imaging of the chest was performed using the standard protocol during bolus administration of intravenous contrast. Multiplanar CT image reconstructions and MIPs were obtained to evaluate the vascular anatomy. CONTRAST:  <See Chart> OMNIPAQUE IOHEXOL 350 MG/ML SOLN COMPARISON:  Chest radiograph, 11/30/2018 FINDINGS: Cardiovascular: Satisfactory opacification of the pulmonary arteries to the segmental level. No evidence of pulmonary embolism. Aortic atherosclerosis. Normal heart size. Three-vessel coronary artery calcifications. No pericardial effusion. Mediastinum/Nodes: No enlarged mediastinal, hilar, or axillary lymph nodes. Thyroid gland, trachea, and esophagus demonstrate no significant findings. There is a fat containing left-sided Bochdalek's type hernia. Lungs/Pleura: Small bilateral pleural effusions, right greater than left, with associated atelectasis or consolidation. Underlying moderate centrilobular emphysema. Upper Abdomen: No acute abnormality. Musculoskeletal: Scattered subcutaneous emphysema about the right chest wall, likely related to recent laparoscopic surgery. No acute or significant osseous findings. Review of the MIP images confirms the above findings. IMPRESSION: 1.  Negative  examination for pulmonary embolism. 2. Small, right greater than left pleural effusions with associated atelectasis or consolidation. 3.  Emphysema. 4.  Coronary artery disease. 5. Scattered subcutaneous emphysema about the right chest wall, likely related to recent laparoscopic surgery. Electronically Signed   By: Eddie Candle M.D.   On: 11/30/2018 19:05    Pending Labs [employer] (From admission, onward)    Start     Ordered  12/07/18 0500  Creatinine, serum  (enoxaparin (LOVENOX)    CrCl >/= 30 ml/min)  Weekly,   R    Comments:  while on enoxaparin therapy    11/30/18 2001   12/01/18 0500  Comprehensive metabolic panel  Tomorrow morning,   R     11/30/18 2001   12/01/18 0500  CBC  Tomorrow morning,   R     11/30/18 2001   12/01/18 0500  Hepatitis panel, acute  Tomorrow morning,   R     11/30/18 2001   12/01/18 0500  Hemoglobin A1c  Tomorrow morning,   R     11/30/18 2001   11/30/18 1958  Troponin I - Now Then Q6H  Now then every 6 hours,   R     11/30/18 2001   11/30/18 1958  Brain natriuretic peptide  Add-on,   R     11/30/18 2001   11/30/18 1957  CK total and CKMB (cardiac)not at [facility]  Add-on,   R     11/30/18 2001          Vitals/Pain Today's Vitals   11/30/18 1630 11/30/18 1730 11/30/18 1800 11/30/18 1830  BP: (!) 147/71 (!) 149/64 (!) 151/60 (!) 143/65  Pulse:  83 73   Resp: (!) 30 (!) 23 (!) 28 (!) 22  Temp:      TempSrc:      SpO2:  92% 95%   Weight:      Height:      PainSc:        Isolation Precautions No active isolations  Medications Medications  enoxaparin (LOVENOX) injection 40 mg (has no administration in time range)  sodium chloride flush (NS) 0.9 % injection 3 mL (has no administration in time range)  sodium chloride flush (NS) 0.9 % injection 3 mL (has no administration in time range)  0.9 %  sodium chloride infusion (has no administration in time range)  acetaminophen (TYLENOL) tablet 650 mg (has no administration in time range)    Or   acetaminophen (TYLENOL) suppository 650 mg (has no administration in time range)  iohexol (OMNIPAQUE) 350 MG/ML injection 75 mL ( Intravenous Contrast Given 11/30/18 1825)    Mobility walks Low fall risk   Focused Assessments    R Recommendations: See Admitting Provider Note  Report given to:   Additional Notes:

## 2018-11-30 NOTE — ED Notes (Signed)
CRITICAL VALUE ALERT  Critical Value:  Troponin 0.05  Date & Time Notied: 11/30/2018, 1717 Provider Notified: Dr. Roderic Palau  Orders Received/Actions taken: see chart

## 2018-11-30 NOTE — H&P (Addendum)
TRH H&P    Patient Demographics:    Sherry Bridges, is a 78 y.o. female  MRN: [id number]  DOB - Nov 14, 1940  Admit Date - 11/30/2018  Referring MD/NP/PA:  Dorthula PerfectJoseph Zammitt  Outpatient Primary MD for the patient is Toma DeitersHasanaj, Xaje A, MD  Patient coming from:   home  Chief complaint-  Dyspnea, low pox    HPI:    Sherry Bridges  is a 78 y.o. female, hypertension, hyperlipidemia, hypothyroidism, former smoker, Copd (not on home o2),  admitted at [facility] for acute cholecystitis  , and [facility].  Pt taken for ERCP on 11/26/2018 unsuccesfull as common bile duct could not be cannulated due to small ampullary orifice, plastic stent placed in main pancreatic duct for prophylactic purposes. Lap chole by [name] on 11/27/2018.  Pt was discharged on 6/4 Thursday and presented today due to c/o dyspnea today as well as low pox. Pt denies fever, chills, cough, cp, palp, n/v, abd pain, diarrhea, brbpr, dysuria. Pt was formerly taking lorazepam but not recently and denies any oxycodone use today.     In ED,  T 98  P 73  R 22, Bp 143/65  Pox 93% on RA  CTA chest IMPRESSION: 1.  Negative examination for pulmonary embolism. 2. Small, right greater than left pleural effusions with associated atelectasis or consolidation. 3.  Emphysema. 4.  Coronary artery disease. 5. Scattered subcutaneous emphysema about the right chest wall, likely related to recent laparoscopic surgery.  Wbc 8.8, Hgb 12.4, Plt 389   Na 139, K 3.7  Bun 8 , creatinine 0.72   Glucose 127 Trop 0.05  EKG nsr at 75, nl axis, nl pr, nl qt int, no st-t changes c/w ischemia.   Pt will be admitted for c/o dyspnea, low pox, as well as Copd , bilateral pleural effusions R>L and possibly Hcap     Review of systems:    In addition to the HPI above,  No Fever-chills, No Headache, No changes with Vision or hearing, No problems  swallowing food or Liquids, No Chest pain, No Cough  No Abdominal pain, No Nausea or Vomiting, bowel movements are regular, No Blood in stool or Urine, No dysuria, No new skin rashes or bruises, No new joints pains-aches,  No new weakness, tingling, numbness in any extremity, No recent weight gain or loss, No polyuria, polydypsia or polyphagia, No significant Mental Stressors.  All other systems reviewed and are negative.    Past History of the following :    Past Medical History:  Diagnosis Date  . Hypothyroidism 01/02/2017      Past Surgical History:  Procedure Laterality Date  . CHOLECYSTECTOMY N/A 11/27/2018   Procedure: LAPAROSCOPIC CHOLECYSTECTOMY;  Surgeon: Lucretia RoersBridges, Lindsay C, MD;  Location: [facility];  Service: General;  Laterality: N/A;  . TUBAL LIGATION        Social History:      Social History   Tobacco Use  . Smoking status: Former Games developermoker  . Smokeless tobacco: Never Used  Substance Use Topics  .  Alcohol use: Yes    Comment: OCCASIONAL       Family History :     Family History  Problem Relation Age of Onset  . CAD Mother   . CAD Father   . CAD Brother        MI at age 5       Home Medications:   Prior to Admission medications   Medication Sig Start Date End Date Taking? Authorizing Provider  atorvastatin (LIPITOR) 40 MG tablet Take 40 mg by mouth daily.    [provider]  co-enzyme Q-10 30 MG capsule Take 30 mg by mouth daily.     [provider]  docusate sodium (COLACE) 100 MG capsule Take 1 capsule (100 mg total) by mouth 2 (two) times daily. 11/27/18 11/27/19  Lucretia Roers, MD  EUTHYROX 88 MCG tablet Take 88 mcg by mouth daily. 11/19/18   [provider]  Multiple Vitamin (MULTIVITAMIN) tablet Take 1 tablet by mouth daily.    [provider]  Omega-3 Fatty Acids (FISH OIL) 1000 MG CAPS Take 1 capsule by mouth daily.     [provider]  ondansetron (ZOFRAN) 4 MG tablet Take 1 tablet (4 mg  total) by mouth daily as needed for nausea or vomiting. 11/27/18 11/27/19  Lucretia Roers, MD  oxyCODONE (OXY IR/ROXICODONE) 5 MG immediate release tablet Take 1 tablet (5 mg total) by mouth every 4 (four) hours as needed for moderate pain. 11/27/18   Lucretia Roers, MD     Allergies:    No Known Allergies   Physical Exam:   Vitals  Blood pressure (!) 143/65, pulse 73, temperature 98 F (36.7 C), temperature source Oral, resp. rate (!) 22, height  (1.626 m), weight 67.6 kg, SpO2 95 %.  1.  General: aoxo3  2. Psychiatric: euthymic  3. Neurologic: cn2-12 intact, reflexes 2+ symmetric, diffuse with no clonus, motor 5/5 in all 4 ext  4. HEENMT:  Anicteric, pupils, 1.19mm symmetric, direct, consensual, near intact Mucous membranes moist Neck: no jvd, no bruit  5. Respiratory : Slight decrease in bs right base, crackles r> left base, no wheezing  6. Cardiovascular : rrr s1, s2, 2/6 sem rusb  7. Gastrointestinal:  Abd: soft, nt, nd, +bs  8. Skin:  Ext: no c/c,  Trace edema, , + varicose veins  9.Musculoskeletal:  Good ROM  No adenopathy    Data Review:    CBC Recent Labs  Lab 11/26/18 0437 11/27/18 0409 11/30/18 1625  WBC 19.8* 13.4* 8.8  HGB 12.6 11.3* 12.4  HCT 40.6 37.3 38.9  PLT 331 305 389  MCV 87.7 89.2 85.1  MCH 27.2 27.0 27.1  MCHC 31.0 30.3 31.9  RDW 14.2 14.1 14.6  LYMPHSABS 1.8 1.5 2.2  MONOABS 2.0* 0.9 1.0  EOSABS 0.0 0.1 0.1  BASOSABS 0.1 0.0 0.1   ------------------------------------------------------------------------------------------------------------------  Results for orders placed or performed during the hospital encounter of 11/30/18 (from the past 48 hour(s))  CBC with Differential/Platelet     Status: Abnormal   Collection Time: 11/30/18  4:25 PM  Result Value Ref Range   WBC 8.8 4.0 - 10.5 K/uL   RBC 4.57 3.87 - 5.11 MIL/uL   Hemoglobin 12.4 12.0 - 15.0 g/dL   HCT 40.9 81.1 - 91.4 %   MCV 85.1 80.0 - 100.0 fL   MCH  27.1 26.0 - 34.0 pg   MCHC 31.9 30.0 - 36.0 g/dL   RDW 78.2 95.6 - 21.3 %  Platelets 389 150 - 400 K/uL   nRBC 0.0 0.0 - 0.2 %   Neutrophils Relative % 55 %   Neutro Abs 4.9 1.7 - 7.7 K/uL   Lymphocytes Relative 25 %   Lymphs Abs 2.2 0.7 - 4.0 K/uL   Monocytes Relative 12 %   Monocytes Absolute 1.0 0.1 - 1.0 K/uL   Eosinophils Relative 2 %   Eosinophils Absolute 0.1 0.0 - 0.5 K/uL   Basophils Relative 1 %   Basophils Absolute 0.1 0.0 - 0.1 K/uL   Immature Granulocytes 5 %   Abs Immature Granulocytes 0.41 (H) 0.00 - 0.07 K/uL    Comment: Performed at [facility], [address] [place], [place] [address]  Comprehensive metabolic panel     Status: Abnormal   Collection Time: 11/30/18  4:25 PM  Result Value Ref Range   Sodium 139 135 - 145 mmol/L   Potassium 3.7 3.5 - 5.1 mmol/L   Chloride 102 98 - 111 mmol/L   CO2 23 22 - 32 mmol/L   Glucose, Bld 127 (H) 70 - 99 mg/dL   BUN 8 8 - 23 mg/dL   Creatinine, Ser 6.040.72 0.44 - 1.00 mg/dL   Calcium 8.9 8.9 - 54.010.3 mg/dL   Total Protein 6.5 6.5 - 8.1 g/dL   Albumin 2.7 (L) 3.5 - 5.0 g/dL   AST 981136 (H) 15 - 41 U/L   ALT 116 (H) 0 - 44 U/L   Alkaline Phosphatase 425 (H) 38 - 126 U/L   Total Bilirubin 0.8 0.3 - 1.2 mg/dL   GFR calc non Af Amer >60 >60 mL/min   GFR calc Af Amer >60 >60 mL/min   Anion gap 14 5 - 15    Comment: Performed at [facility], [address] [place], [place] [address]  Troponin I - ONCE - STAT     Status: Abnormal   Collection Time: 11/30/18  4:25 PM  Result Value Ref Range   Troponin I 0.05 (HH) <0.03 ng/mL    Comment: CRITICAL RESULT CALLED TO, READ BACK BY AND VERIFIED WITH: VOGLER,[id number]  BY MATTHEWS,B 6.7.2020 Performed at [facility], [address] [place], [place] [address]   D-dimer, quantitative (not at [facility])     Status: Abnormal   Collection Time: 11/30/18  4:25 PM  Result Value Ref Range   D-Dimer, Quant 4.79 (H) 0.00 - 0.50 ug/mL-FEU    Comment: (NOTE) At the manufacturer cut-off of 0.50  ug/mL FEU, this assay has been documented to exclude PE with a sensitivity and negative predictive value of 97 to 99%.  At this time, this assay has not been approved by the FDA to exclude DVT/VTE. Results should be correlated with clinical presentation. Performed at [facility], [address] [place], [place] [address]   SARS Coronavirus 2 (CEPHEID- Performed in [facility] hospital lab), Hosp Order     Status: None   Collection Time: 11/30/18  6:24 PM  Result Value Ref Range   SARS Coronavirus 2 NEGATIVE NEGATIVE    Comment: (NOTE) If result is NEGATIVE SARS-CoV-2 target nucleic acids are NOT DETECTED. The SARS-CoV-2 RNA is generally detectable in upper and lower  respiratory specimens during the acute phase of infection. The lowest  concentration of SARS-CoV-2 viral copies this assay can detect is 250  copies / mL. A negative result does not preclude SARS-CoV-2 infection  and should not be used as the sole basis for treatment or other  patient management decisions.  A negative result may occur  with  improper specimen collection / handling, submission of specimen other  than nasopharyngeal swab, presence of viral mutation(s) within the  areas targeted by this assay, and inadequate number of viral copies  (<250 copies / mL). A negative result must be combined with clinical  observations, patient history, and epidemiological information. If result is POSITIVE SARS-CoV-2 target nucleic acids are DETECTED. The SARS-CoV-2 RNA is generally detectable in upper and lower  respiratory specimens dur ing the acute phase of infection.  Positive  results are indicative of active infection with SARS-CoV-2.  Clinical  correlation with patient history and other diagnostic information is  necessary to determine patient infection status.  Positive results do  not rule out bacterial infection or co-infection with other viruses. If result is PRESUMPTIVE POSTIVE SARS-CoV-2 nucleic acids MAY BE  PRESENT.   A presumptive positive result was obtained on the submitted specimen  and confirmed on repeat testing.  While 2019 novel coronavirus  (SARS-CoV-2) nucleic acids may be present in the submitted sample  additional confirmatory testing may be necessary for epidemiological  and / or clinical management purposes  to differentiate between  SARS-CoV-2 and other Sarbecovirus currently known to infect humans.  If clinically indicated additional testing with an alternate test  methodology [phone] is advised. The SARS-CoV-2 RNA is generally  detectable in upper and lower respiratory sp ecimens during the acute  phase of infection. The expected result is Negative. Fact Sheet for Patients:  BoilerBrush.com.cyhttps://www.fda.gov/media/136312/download Fact Sheet for Healthcare Providers: https://pope.com/https://www.fda.gov/media/136313/download This test is not yet approved or cleared by the [place] FDA and has been authorized for detection and/or diagnosis of SARS-CoV-2 by FDA under an Emergency Use Authorization (EUA).  This EUA will remain in effect (meaning this test can be used) for the duration of the COVID-19 declaration under Section 564(b)(1) of the Act, 21 U.S.C. section 360bbb-3(b)(1), unless the authorization is terminated or revoked sooner. Performed at [facility], [address] [place], [place] [address]     Chemistries  Recent Labs  Lab 11/26/18 [phone] [id number] 11/27/18 0409 11/30/18 1625  NA 136 138 139  K 4.2 4.4 3.7  CL 105 108 102  CO2 24 23 23   GLUCOSE 92 86 127*  BUN 12 17 8   CREATININE 0.75 0.90 0.72  CALCIUM 8.3* 7.9* 8.9  MG  --  1.9  --   AST 17 47* 136*  ALT 13 26 116*  ALKPHOS 46 150* 425*  BILITOT 0.7 1.2 0.8   ------------------------------------------------------------------------------------------------------------------  ------------------------------------------------------------------------------------------------------------------ GFR: Estimated Creatinine Clearance:  55.7 mL/min (by C-G formula based on SCr of 0.72 mg/dL). Liver Function Tests: Recent Labs  Lab 11/26/18 0437 11/27/18 0409 11/30/18 1625  AST 17 47* 136*  ALT 13 26 116*  ALKPHOS 46 150* 425*  BILITOT 0.7 1.2 0.8  PROT 5.7* 5.1* 6.5  ALBUMIN 2.7* 2.3* 2.7*   Recent Labs  Lab 11/26/18 0437 11/27/18 0409  LIPASE 21  --   AMYLASE  --  138*   No results for input(s): AMMONIA in the last 168 hours. Coagulation Profile: Recent Labs  Lab 11/26/18 0437  INR 1.3*   Cardiac Enzymes: Recent Labs  Lab 11/30/18 1625  TROPONINI 0.05*   BNP (last 3 results) No results for input(s): PROBNP in the last 8760 hours. HbA1C: No results for input(s): HGBA1C in the last 72 hours. CBG: No results for input(s): GLUCAP in the last 168 hours. Lipid Profile: No results for input(s): CHOL, HDL, LDLCALC, TRIG, CHOLHDL, LDLDIRECT in the last 72 hours. Thyroid  Function Tests: No results for input(s): TSH, T4TOTAL, FREET4, T3FREE, THYROIDAB in the last 72 hours. Anemia Panel: No results for input(s): VITAMINB12, FOLATE, FERRITIN, TIBC, IRON, RETICCTPCT in the last 72 hours.  --------------------------------------------------------------------------------------------------------------- Urine analysis: No results found for: COLORURINE, APPEARANCEUR, LABSPEC, PHURINE, GLUCOSEU, HGBUR, BILIRUBINUR, KETONESUR, PROTEINUR, UROBILINOGEN, NITRITE, LEUKOCYTESUR    Imaging Results:    Dg Chest 2 View  Result Date: 11/30/2018 CLINICAL DATA:  Shortness of breath and hypoxia. EXAM: CHEST - 2 VIEW COMPARISON:  None. FINDINGS: Lungs are adequately inflated demonstrate small bilateral pleural effusions likely with associated bibasilar atelectasis. Minimal prominence of the perihilar vessels suggesting mild degree of vascular congestion. Cardiomediastinal silhouette is unremarkable. There are degenerative changes of the spine. IMPRESSION: Suggestion of mild vascular congestion. Small bilateral pleural effusions  with associated bibasilar atelectasis. Infection in the lung bases is possible. Electronically Signed   By: Elberta Fortis M.D.   On: 11/30/2018 17:02   Ct Angio Chest Pe W And/or Wo Contrast  Result Date: 11/30/2018 CLINICAL DATA:  Hypoxia, recent cholecystectomy EXAM: CT ANGIOGRAPHY CHEST WITH CONTRAST TECHNIQUE: Multidetector CT imaging of the chest was performed using the standard protocol during bolus administration of intravenous contrast. Multiplanar CT image reconstructions and MIPs were obtained to evaluate the vascular anatomy. CONTRAST:  <See Chart> OMNIPAQUE IOHEXOL 350 MG/ML SOLN COMPARISON:  Chest radiograph, 11/30/2018 FINDINGS: Cardiovascular: Satisfactory opacification of the pulmonary arteries to the segmental level. No evidence of pulmonary embolism. Aortic atherosclerosis. Normal heart size. Three-vessel coronary artery calcifications. No pericardial effusion. Mediastinum/Nodes: No enlarged mediastinal, hilar, or axillary lymph nodes. Thyroid gland, trachea, and esophagus demonstrate no significant findings. There is a fat containing left-sided Bochdalek's type hernia. Lungs/Pleura: Small bilateral pleural effusions, right greater than left, with associated atelectasis or consolidation. Underlying moderate centrilobular emphysema. Upper Abdomen: No acute abnormality. Musculoskeletal: Scattered subcutaneous emphysema about the right chest wall, likely related to recent laparoscopic surgery. No acute or significant osseous findings. Review of the MIP images confirms the above findings. IMPRESSION: 1.  Negative examination for pulmonary embolism. 2. Small, right greater than left pleural effusions with associated atelectasis or consolidation. 3.  Emphysema. 4.  Coronary artery disease. 5. Scattered subcutaneous emphysema about the right chest wall, likely related to recent laparoscopic surgery. Electronically Signed   By: Lauralyn Primes M.D.   On: 11/30/2018 19:05       Assessment & Plan:     Active Problems:   Hypothyroidism   Essential hypertension   Hyperlipidemia   Dyspnea   Abnormal liver function   Elevated troponin  Dyspnea, acute hypoxic respiratory failure (per pt pox low 80's at home) ddx Hcap, Copd Blood culture x2 No sputum gs/ cx due to no cough Urine strep, urine legionella antigen vanco iv, cefepime iv pharmacy to dose  Bilateral pleural effusions right >left (seems too small to tap) Check cardiac echo Abx as above  Copd Start Anoro 1puff qday Albuterol neb q6h prn  Please arrange PFT as outpatient  Elevated troponin, ? Demand ischemia, due to hypoxia Tele Trop I q6h x3 Check cardiac echo If troponin trending upwards, please consult cardiology  H/o Acute cholecystitis w Lap Chole 11/27/2018, ERCP w pancreatic duct plastic stent 11/26/2018 GI consult ordered in computer Surgery consult ordered in computer  Abnormal liver function Check acute hepatitis panel, cpk STOP Lipitor Check cmp in am  Hypothyroidism Cont Levothyroxine  H/o Hypertension/ Hyperlipidemia Stop Lipitor as above Monitor bp Hydralazine  iv q6h prn sbp >160   DVT Prophylaxis-   Lovenox -  SCDs   AM Labs Ordered, also please review Full Orders  Family Communication: Admission, patients condition and plan of care including tests being ordered have been discussed with the patient who indicate understanding and agree with the plan and Code Status.  Code Status:  FULL CODE, spoke with her husband and updated him of her status.    Admission status: Inpatient: Based on patients clinical presentation and evaluation of above clinical data, I have made determination that patient meets Inpatient criteria at this time. Pt will require w/up of dyspnea w hypoxic resp failure  probably secondary to Hcap w effusions vs Copd.  Pt will require iv abx, and initiation of inhaler/ nebulizer therapy, and possibly sending her home w o2 [place].  Pt has significant risk of clinical deterioration  without admission and will need > 2 nites stay.   Time spent in minutes : 70   Jani Gravel M.D on 11/30/2018 at 8:18 PM

## 2018-11-30 NOTE — Progress Notes (Signed)
Called patient to see why they had not arrived to ED. They had been going to get CXR in [place] at one of the NP daughters practice. I told them I recommended not doing this as it would be and incomplete work up. They are headed to [facility] now.  Notified ED, Dr. Lacinda Axon.  Curlene Labrum, MD

## 2018-12-01 ENCOUNTER — Encounter (HOSPITAL_COMMUNITY): Payer: Self-pay | Admitting: Internal Medicine

## 2018-12-01 ENCOUNTER — Inpatient Hospital Stay (HOSPITAL_COMMUNITY): Payer: Medicare Other

## 2018-12-01 ENCOUNTER — Other Ambulatory Visit (HOSPITAL_COMMUNITY): Payer: Self-pay | Admitting: Internal Medicine

## 2018-12-01 DIAGNOSIS — R933 Abnormal findings on diagnostic imaging of other parts of digestive tract: Secondary | ICD-10-CM

## 2018-12-01 DIAGNOSIS — D649 Anemia, unspecified: Secondary | ICD-10-CM

## 2018-12-01 DIAGNOSIS — E785 Hyperlipidemia, unspecified: Secondary | ICD-10-CM

## 2018-12-01 DIAGNOSIS — I351 Nonrheumatic aortic (valve) insufficiency: Secondary | ICD-10-CM

## 2018-12-01 DIAGNOSIS — I340 Nonrheumatic mitral (valve) insufficiency: Secondary | ICD-10-CM

## 2018-12-01 DIAGNOSIS — R945 Abnormal results of liver function studies: Secondary | ICD-10-CM

## 2018-12-01 DIAGNOSIS — K8050 Calculus of bile duct without cholangitis or cholecystitis without obstruction: Secondary | ICD-10-CM

## 2018-12-01 DIAGNOSIS — I10 Essential (primary) hypertension: Secondary | ICD-10-CM

## 2018-12-01 LAB — COMPREHENSIVE METABOLIC PANEL
ALT: 75 U/L — ABNORMAL HIGH (ref 0–44)
AST: 67 U/L — ABNORMAL HIGH (ref 15–41)
Albumin: 2.2 g/dL — ABNORMAL LOW (ref 3.5–5.0)
Alkaline Phosphatase: 299 U/L — ABNORMAL HIGH (ref 38–126)
Anion gap: 7 (ref 5–15)
BUN: 7 mg/dL — ABNORMAL LOW (ref 8–23)
CO2: 25 mmol/L (ref 22–32)
Calcium: 8.1 mg/dL — ABNORMAL LOW (ref 8.9–10.3)
Chloride: 105 mmol/L (ref 98–111)
Creatinine, Ser: 0.58 mg/dL (ref 0.44–1.00)
GFR calc Af Amer: >60 mL/min (ref >60)
GFR calc non Af Amer: >60 mL/min (ref >60)
Glucose, Bld: 95 mg/dL (ref 70–99)
Potassium: 3.8 mmol/L (ref 3.5–5.1)
Sodium: 137 mmol/L (ref 135–145)
Total Bilirubin: 0.8 mg/dL (ref 0.3–1.2)
Total Protein: 5.3 g/dL — ABNORMAL LOW (ref 6.5–8.1)

## 2018-12-01 LAB — CBC
HCT: 30.9 % — ABNORMAL LOW (ref 36.0–46.0)
Hemoglobin: 10.1 g/dL — ABNORMAL LOW (ref 12.0–15.0)
MCH: 27.3 pg (ref 26.0–34.0)
MCHC: 32.7 g/dL (ref 30.0–36.0)
MCV: 83.5 fL (ref 80.0–100.0)
Platelets: 347 10*3/uL (ref 150–400)
RBC: 3.7 MIL/uL — ABNORMAL LOW (ref 3.87–5.11)
RDW: 14.4 % (ref 11.5–15.5)
WBC: 9.5 10*3/uL (ref 4.0–10.5)
nRBC: 0 % (ref 0.0–0.2)

## 2018-12-01 LAB — HEMOGLOBIN A1C
Hgb A1c MFr Bld: 5.9 % — ABNORMAL HIGH (ref 4.8–5.6)
Mean Plasma Glucose: 122.63 mg/dL

## 2018-12-01 LAB — CK TOTAL AND CKMB (NOT AT ARMC)
CK, MB: 1.9 ng/mL (ref 0.5–5.0)
Relative Index: 1.2 (ref 0.0–2.5)
Total CK: 156 U/L (ref 38–234)

## 2018-12-01 LAB — TROPONIN I
Troponin I: 0.04 ng/mL (ref <0.03)
Troponin I: 0.04 ng/mL (ref <0.03)

## 2018-12-01 LAB — ECHOCARDIOGRAM COMPLETE
Height: 64 in
Weight: 2529.12 oz

## 2018-12-01 LAB — AMYLASE: Amylase: 63 U/L (ref 28–100)

## 2018-12-01 MED ORDER — ALBUTEROL SULFATE (2.5 MG/3ML) 0.083% IN NEBU: 2.5000 mg | INHALATION_SOLUTION | Freq: Four times a day (QID) | RESPIRATORY_TRACT | Status: DC | PRN

## 2018-12-01 MED ORDER — HYDRALAZINE HCL 20 MG/ML IJ SOLN: 5.0000 mg | Freq: Four times a day (QID) | INTRAMUSCULAR | Status: DC | PRN

## 2018-12-01 MED ORDER — GUAIFENESIN ER 600 MG PO TB12
1200.0000 mg | ORAL_TABLET | Freq: Two times a day (BID) | ORAL | Status: DC
  Administered 2018-12-01 – 2018-12-03 (×4): 1200 mg via ORAL
  Filled 2018-12-01 (×4): qty 2

## 2018-12-01 MED ORDER — FUROSEMIDE 10 MG/ML IJ SOLN
40.0000 mg | Freq: Once | INTRAMUSCULAR | Status: AC
  Administered 2018-12-01: 40 mg via INTRAVENOUS
  Filled 2018-12-01: qty 4

## 2018-12-01 MED ORDER — ZOLPIDEM TARTRATE 5 MG PO TABS
5.0000 mg | ORAL_TABLET | Freq: Every evening | ORAL | Status: DC | PRN
  Administered 2018-12-01 – 2018-12-02 (×2): 5 mg via ORAL
  Filled 2018-12-01 (×2): qty 1

## 2018-12-01 MED ORDER — UMECLIDINIUM-VILANTEROL 62.5-25 MCG/INH IN AEPB
1.0000 | INHALATION_SPRAY | Freq: Every day | RESPIRATORY_TRACT | Status: DC
  Administered 2018-12-02 – 2018-12-03 (×2): 1 via RESPIRATORY_TRACT
  Filled 2018-12-01: qty 14

## 2018-12-01 NOTE — Progress Notes (Signed)
RN paged Tylene Fantasia, NP to request something to help patient sleep tonight per patient's request, awaiting response.  P.J. Linus Mako, RN

## 2018-12-01 NOTE — Consult Note (Signed)
Referring Provider: Pearson GrippeJames Kim ,MD Primary Care Physician:  Toma DeitersHasanaj, Xaje A, MD Primary Gastroenterologist:  Dr. Karilyn Cotaehman  Reason for Consultation:   Choledocholithiasis  HPI:   Patient is 78 year old Caucasian female who was admitted to this facility last week[facility] for acute cholecystitis and choledocholithiasis. Her transaminases were normal on presentation.  She underwent ERCP on 11/26/2018.  He had very small ampulla/orifice which was difficult to locate.  Pancreatic duct was cannulated with guidewire but bile duct could not be cannulated.  Pancreatic stent was left in place for prophylactic purposes.  Since she was not felt to have obstruction she went on to have laparoscopic cholecystectomy on on 11/27/2018 by Dr. Larae GroomsLindsey Bridges.  She was able to go home.  She wanted to wait for repeat ERCP. Patient was brought to emergency room yesterday because of exertional dyspnea and she will notice her O2 sat will drop to the low 80s.  She did not experience chest pain cough fever or chills.  Patient was evaluated in emergency room.  D-dimer was elevated.  Chest film and chest CTA showed bilateral pleural effusions right greater than left but no evidence of pneumonia.  Patient's transaminases and alkaline phosphatase were elevated but bili was normal.  Troponin and BNP levels were mildly elevated.  She was hospitalized for further evaluation.  Echo from this morning is pending.  Patient says she feels better.  She denies dyspnea at rest.  She did walk to the bathroom and did not get short of breath.  She denies chest pain cough fever or chills.  She reports abdominal pain/soreness right upper quadrant laterally.  She says this pain is different than the pain that she had on her last presentation.  She is hungry.  She had loose stool yesterday.  She denies melena or rectal bleeding.  She states she has not smoked a cigarette since she left the hospital.  Past Medical History:  Diagnosis Date  .  Hypothyroidism 01/02/2017        Hyperlipidemia.  Past Surgical History:  Procedure Laterality Date  . BILIARY STENT PLACEMENT  11/26/2018   Procedure: BILIARY STENT PLACEMENT (PANCREATIC);  Surgeon: Malissa Hippoehman, Alorah Mcree U, MD;  Location: [facility];  Service: Endoscopy;;  . CHOLECYSTECTOMY N/A 11/27/2018   Procedure: LAPAROSCOPIC CHOLECYSTECTOMY;  Surgeon: Lucretia RoersBridges, Lindsay C, MD;  Location: [facility];  Service: General;  Laterality: N/A;  . ERCP N/A 11/26/2018   Procedure: UNSUCCESSFUL ENDOSCOPIC RETROGRADE CHOLANGIOPANCREATOGRAPHY (ERCP) BECAUSE OF SMALL ORIFICE AND EDEMA;  Surgeon: Malissa Hippoehman, Jaena Brocato U, MD;  Location: [facility];  Service: Endoscopy;  Laterality: N/A;  . TUBAL LIGATION      Prior to Admission medications   Medication Sig Start Date End Date Taking? Authorizing Provider  atorvastatin (LIPITOR) 40 MG tablet Take 40 mg by mouth daily at 6 PM.    Yes [provider]  co-enzyme Q-10 30 MG capsule Take 30 mg by mouth daily.    Yes [provider]  docusate sodium (COLACE) 100 MG capsule Take 1 capsule (100 mg total) by mouth 2 (two) times daily. 11/27/18 11/27/19 Yes Lucretia RoersBridges, Lindsay C, MD  EUTHYROX 88 MCG tablet Take 88 mcg by mouth daily. 11/19/18  Yes [provider]  Multiple Vitamin (MULTIVITAMIN) tablet Take 1 tablet by mouth daily.   Yes [provider]  naproxen sodium (ALEVE) 220 MG tablet Take 220 mg by mouth daily as needed (FOR PAIN).   Yes [provider]  Omega-3 Fatty Acids (FISH OIL) 1000 MG CAPS  Take 1 capsule by mouth daily.    Yes [provider]  ondansetron (ZOFRAN) 4 MG tablet Take 1 tablet (4 mg total) by mouth daily as needed for nausea or vomiting. 11/27/18 11/27/19 Yes Lucretia RoersBridges, Lindsay C, MD  oxyCODONE (OXY IR/ROXICODONE) 5 MG immediate release tablet Take 1 tablet (5 mg total) by mouth every 4 (four) hours as needed for moderate pain. Patient taking differently: Take 2.5 mg by mouth at bedtime as needed for moderate  pain. Prescribed 5mg  every 4 hours as needed for pain/ 11/27/18  Yes Lucretia RoersBridges, Lindsay C, MD    Current Facility-Administered Medications  Medication Dose Route Frequency Provider Last Rate Last Dose  . 0.9 %  sodium chloride infusion  250 mL Intravenous PRN Pearson GrippeKim, James, MD      . acetaminophen (TYLENOL) tablet 650 mg  650 mg Oral Q6H PRN Pearson GrippeKim, James, MD       Or  . acetaminophen (TYLENOL) suppository 650 mg  650 mg Rectal Q6H PRN Pearson GrippeKim, James, MD      . docusate sodium (COLACE) capsule 100 mg  100 mg Oral BID Pearson GrippeKim, James, MD      . enoxaparin (LOVENOX) injection 40 mg  40 mg Subcutaneous Q24H Pearson GrippeKim, James, MD      . furosemide (LASIX) injection 40 mg  40 mg Intravenous Once Sheikh, Kateri McOmair Latif, DO      . levothyroxine (SYNTHROID) tablet 88 mcg  88 mcg Oral Q0600 Pearson GrippeKim, James, MD   88 mcg at 12/01/18 [phone]  . nicotine (NICODERM CQ - dosed in mg/24 hours) patch 21 mg  21 mg Transdermal Daily Pearson GrippeKim, James, MD      . ondansetron [facility](ZOFRAN) injection 4 mg  4 mg Intravenous Q6H PRN Pearson GrippeKim, James, MD      . oxyCODONE (Oxy IR/ROXICODONE) immediate release tablet 5 mg  5 mg Oral Q4H PRN Pearson GrippeKim, James, MD   5 mg at 12/01/18 0047  . sodium chloride flush (NS) 0.9 % injection 3 mL  3 mL Intravenous Q12H Pearson GrippeKim, James, MD      . sodium chloride flush (NS) 0.9 % injection 3 mL  3 mL Intravenous PRN Pearson GrippeKim, James, MD        Allergies as of 11/30/2018  . (No Known Allergies)    Family History  Problem Relation Age of Onset  . CAD Mother   . CAD Father   . CAD Brother        MI at age 78    Social History   Socioeconomic History  . Marital status: Married    Spouse name: Not on file  . Number of children: Not on file  . Years of education: Not on file  . Highest education level: Not on file  Occupational History  . Not on file  Social Needs  . Financial resource strain: Not on file  . Food insecurity:    Worry: Not on file    Inability: Not on file  . Transportation needs:    Medical: Not on file    Non-medical:  Not on file  Tobacco Use  . Smoking status: Former Games developermoker  . Smokeless tobacco: Never Used  Substance and Sexual Activity  . Alcohol use: Yes    Comment: OCCASIONAL  . Drug use: No  . Sexual activity: Not on file  Lifestyle  . Physical activity:    Days per week: Not on file    Minutes per session: Not on file  . Stress: Not on file  Relationships  .  Social connections:    Talks on phone: Not on file    Gets together: Not on file    Attends religious service: Not on file    Active member of club or organization: Not on file    Attends meetings of clubs or organizations: Not on file    Relationship status: Not on file  . Intimate partner violence:    Fear of current or ex partner: Not on file    Emotionally abused: Not on file    Physically abused: Not on file    Forced sexual activity: Not on file  Other Topics Concern  . Not on file  Social History Narrative  . Not on file    Review of Systems: See HPI, otherwise normal ROS  Physical Exam: Temp:  [98 F (36.7 C)-98.2 F (36.8 C)] 98.2 F (36.8 C) (06/08 0609) Pulse Rate:  [62-84] 72 (06/08 0609) Resp:  [18-30] 18 (06/08 0609) BP: (128-151)/(52-72) 149/72 (06/08 0609) SpO2:  [91 %-95 %] 91 % (06/08 0609) Weight:  [67.6 kg-71.7 kg] 71.7 kg (06/07 2116)   Patient is alert and in no acute distress. Conjunctive is pink.  Sclerae nonicteric. Oral pharyngeal mucosa is normal.  She has upper and lower dentures in place. Neck without masses or thyromegaly.  JVD is not elevated. Cardiac exam with regular rhythm normal S1 and S2.  She has grade 2/6 systolic ejection murmur best heard at left upper sternal border. Auscultation of lungs reveal vesicular breath sounds bilaterally. Abdomen is symmetrical.  Laparoscopy sites with erythema but no drainage.  Bowel sounds are normal.  She has mild tenderness in mid epigastric region.  No organomegaly or masses noted. She does not have peripheral edema or clubbing.   Lab  Results: Recent Labs    11/30/18 1625 12/01/18 0402  WBC 8.8 9.5  HGB 12.4 10.1*  HCT 38.9 30.9*  PLT 389 347   BMET Recent Labs    11/30/18 1625 12/01/18 0402  NA 139 137  K 3.7 3.8  CL 102 105  CO2 23 25  GLUCOSE 127* 95  BUN 8 7*  CREATININE 0.72 0.58  CALCIUM 8.9 8.1*   LFT Recent Labs    12/01/18 0402  PROT 5.3*  ALBUMIN 2.2*  AST 67*  ALT 75*  ALKPHOS 299*  BILITOT 0.8   PT/INR No results for input(s): LABPROT, INR in the last 72 hours. Hepatitis Panel No results for input(s): HEPBSAG, HCVAB, HEPAIGM, HEPBIGM in the last 72 hours.  Studies/Results: Dg Chest 2 View  Result Date: 11/30/2018 CLINICAL DATA:  Shortness of breath and hypoxia. EXAM: CHEST - 2 VIEW COMPARISON:  None. FINDINGS: Lungs are adequately inflated demonstrate small bilateral pleural effusions likely with associated bibasilar atelectasis. Minimal prominence of the perihilar vessels suggesting mild degree of vascular congestion. Cardiomediastinal silhouette is unremarkable. There are degenerative changes of the spine. IMPRESSION: Suggestion of mild vascular congestion. Small bilateral pleural effusions with associated bibasilar atelectasis. Infection in the lung bases is possible. Electronically Signed   By: Elberta Fortisaniel  Boyle M.D.   On: 11/30/2018 17:02   Ct Angio Chest Pe W And/or Wo Contrast  Result Date: 11/30/2018 CLINICAL DATA:  Hypoxia, recent cholecystectomy EXAM: CT ANGIOGRAPHY CHEST WITH CONTRAST TECHNIQUE: Multidetector CT imaging of the chest was performed using the standard protocol during bolus administration of intravenous contrast. Multiplanar CT image reconstructions and MIPs were obtained to evaluate the vascular anatomy. CONTRAST:  <See Chart> OMNIPAQUE IOHEXOL 350 MG/ML SOLN COMPARISON:  Chest radiograph, 11/30/2018 FINDINGS: Cardiovascular:  Satisfactory opacification of the pulmonary arteries to the segmental level. No evidence of pulmonary embolism. Aortic atherosclerosis. Normal  heart size. Three-vessel coronary artery calcifications. No pericardial effusion. Mediastinum/Nodes: No enlarged mediastinal, hilar, or axillary lymph nodes. Thyroid gland, trachea, and esophagus demonstrate no significant findings. There is a fat containing left-sided Bochdalek's type hernia. Lungs/Pleura: Small bilateral pleural effusions, right greater than left, with associated atelectasis or consolidation. Underlying moderate centrilobular emphysema. Upper Abdomen: No acute abnormality. Musculoskeletal: Scattered subcutaneous emphysema about the right chest wall, likely related to recent laparoscopic surgery. No acute or significant osseous findings. Review of the MIP images confirms the above findings. IMPRESSION: 1.  Negative examination for pulmonary embolism. 2. Small, right greater than left pleural effusions with associated atelectasis or consolidation. 3.  Emphysema. 4.  Coronary artery disease. 5. Scattered subcutaneous emphysema about the right chest wall, likely related to recent laparoscopic surgery. Electronically Signed   By: Eddie Candle M.D.   On: 11/30/2018 19:05   I have reviewed chest film and chest CTA.  Assessment;  Patient is 78 year old Caucasian female who is status post unsuccessful ERCP(09/12/2018) for choledocholithiasis and status post laparoscopic cholecystectomy for acute cholecystitis(11/27/2018) who now presents with exertional with minimal exertion and noted to have small bilateral pleural effusions mildly elevated troponin level and BNP.  Her transaminases and alkaline phosphatase are elevated but have decreased since yesterday and bilirubin remains normal. She is afebrile and does not have leukocytosis.  Therefore I do not feel she has acute cholangitis. She will undergo ERCP when deemed to be stable medically and evaluation has been completed.  Please note echo from this morning is pending.  Anemia.  Her hemoglobin has dropped by about 2 g since she was here last week.   No evidence of GI bleed.   Recommendations;  ERCP with biliary sphincterotomy and stone extraction as well as removal of pancreatic stone when patient cleared medically for the procedure.   LOS: 1 day   Casee Knepp  12/01/2018, 9:14 AM

## 2018-12-01 NOTE — Progress Notes (Signed)
PROGRESS NOTE    Sherry Bridges  EAV:[id number] DOB: 10-Jul-1940 DOA: 11/30/2018 PCP: Sherry Bridges, Sherry A, MD  Brief Narrative:  HPI per Dr. Pearson GrippeJames Bridges on 11/30/2018 Sherry Bridges  is Bridges 78 y.o. female, hypertension, hyperlipidemia, hypothyroidism, former smoker, Copd (not on home o2),  [facility]ole[facility]stitis  , and transferred to [facility] for ERCP.  Pt taken for ERCP on 11/26/2018 unsuccesfull as common bile duct could not be cannulated due to small ampullary orifice, plastic stent placed in main pancreatic duct for prophylactic purposes. Lap chole by Sherry GreenhouseLindsay Bridges on 11/27/2018.  Pt was discharged on 6/4 Thursday and presented today due to c/o dyspnea today as well as low pox. Pt denies fever, chills, cough, cp, palp, n/v, abd pain, diarrhea, brbpr, dysuria. Pt was formerly taking lorazepam but not recently and denies any oxycodone use today.     In ED,  T 98  P 73  R 22, Bp 143/65  Pox 93% on RA  CTA chest IMPRESSION: 1. Negative examination for pulmonary embolism. 2. Small, right greater than left pleural effusions with associated atelectasis or consolidation. 3. Emphysema. 4. Coronary artery disease. 5. Scattered subcutaneous emphysema about the right chest wall, likely related to recent laparoscopic surgery.  Wbc 8.8, Hgb 12.4, Plt 389   Na 139, K 3.7  Bun 8 , creatinine 0.72   Glucose 127 Trop 0.05  EKG nsr at 75, nl axis, nl pr, nl qt int, no st-t changes c/w ischemia.   Pt will be admitted for c/o dyspnea, low pox, as well as Copd , bilateral pleural effusions R>L and possibly Hcap   **Interim History Was diuresed and underwent echocardiogram which showed diastolic dysfunction.  She is improved respiratory wise and was no longer requiring supplemental oxygen via nasal cannula and O2 saturations were in the 90s.  We will repeat diuresis tonight and patient was started on medications for COPD.  Will need outpatient PFTs.  Assessment & Plan:   Active  Problems:   Hypothyroidism   Essential hypertension   Hyperlipidemia   Dyspnea   Abnormal liver function   Elevated troponin   Acute respiratory failure with hypoxia (HCC)  Acute Respiratory Failure with Hypoxia secondary to acute Diastolic CHF exacerbation with questionable COPD component  -Patient is currently afebrile and has no leukocytosis -Was having desaturations with home pulse oximetry with oxygen dropping to the low 80s along with dyspnea on exertion -Chest x-ray showed suggestion of mild vascular congestion and there are small bilateral pleural effusions with associated bibasilar atelectasis.  Infection in the lung bases was possible however do not feel that this is truly an infection likely this is atelectasis -Because of elevated d-dimer 4.79 Bridges CTA of the chest was done and showed "Negative examination for pulmonary embolism. Small, right greater than left pleural effusions with associated atelectasis or consolidation. Emphysema. Coronary artery disease. Scattered subcutaneous emphysema about the right chest wall, likely related to recent laparoscopic surgery." -BNP on admission was 228  -SARS-CoV-2 testing was negative -Echocardiogram done and showed an EF of 60 to 65% but did diastolic dysfunction which is new for the patient -We will diurese with IV Lasix 40 mg twice today and repeat volume assessment in Bridges.m. -Patient is currently off of oxygen now -Continuous pulse oximetry and maintain O2 saturations greater than 90% -Continue with supplemental oxygen via nasal cannula and wean O2 as tolerated currently is not on any -We will add incentive spirometry, flutter valve and guaifenesin 1200 mg p.o. twice  daily -Repeat chest x-ray in Bridges.m.  Acute Diastolic CHF Exacerbation -Echo showed EF of 60 to 65% with diastolic dysfunction -BNP on admission was 228 -Troponins were cycled and they were minimally elevated but flat and not really indicative of ACS -Patient not complaining of  any chest pain and shortness of breath is improved with diuresis -Patient was dyspneic on exertion -We will continue with strict I's and O's, daily weights and fluid restriction -We will need to control blood pressure adequately -CTA of the chest did show some coronary artery disease and she will benefit from an outpatient cardiology evaluation and will notify Cardiology to set up via Epic Consult  -Continue to Monitor Volume Status Carefully and avoid Volume Overload -Will need Ambulatory Home O2 Screen Prior to D/C  Bilateral Pleural Effusions  -Right >Left (seems too small to tap) and seen on CTA  -Checked Transthoracic ECHO as below -Antibiotics Never Started -Given 2 Doses of IV Lasix  -Repeat CXR in AM   COPD with Emphysema -Started on Umeclidinium-Vilanterol 62.5-25 mcg/IH 1 puff Inhalation Daily  -Started Albuterol Neb 2.5 mg q6h prn  -Will need to Arrange PFTs as outpatient  Elevated Troponin -In the setting of Demand Ischemia from CHF Exacerbation and Hypoxia  -Trended Troponin and they were 0.05 -> 0.05 -> 0.04 -> 0.04 and Flat and not indicative of ACS -C/w Telemetry Monitoring  -Checked Cardiac ECHOCardiogram as below; showed "The left ventricle has normal systolic function, with an ejection fraction of 60-65%. The cavity size was normal. There is no increase in left ventricular wall thickness. Left ventricular diastolic Doppler parameters are consistent with impaired relaxation. No evidence of left ventricular regional wall motion abnormalities." -Denies any CP  -EKG showed Bridges Sinus Rhythm at 74 and was non-ischemic   H/o Acute cholecystitis w Lap Chole 11/27/2018, ERCP w pancreatic duct plastic stent 11/26/2018 -Had Unsuccessful ERCP on 11/26/2018 and then had Cholecystectomy on 11/27/2018 -Surgery Consulted and recommends no Surgical Interventions at this time -Plan is for repeat ERCP with Biliary Sphincterotomy and Stone Extraction as well as removal of Pancreatic Plastic  Stent in AM   Abnormal LFTs -Trending down as AST went from 136 -> 67 and ALT went from 116 -> 75 -Acute Hepatitis Panel Pending and KC Total was 156 -Stopped Atorvastatin due to Abnormal LFTs -Continue to Monitor and Trend and Repeat CMP in AM   Hypothyroidism -TSH last Admission was 1.674 -Contine Levothyroxine 88 mcg po Daily   H/o Hypertension -Continue to Monitor BP Closely; Last value was 141/66 -Started Hydralazine 5 mg IV q6h prn SBP >160 or DBP >100  Hyperlipidemia -Because of Abnormal LFTs, Atorvastatin has been currently stopped   Tobacco Abuse -Smoking Cessation Counseling given -C/w Nicotine 21 mg TD q24h  Pre-Diabetes -Patient's HbA1c was 5.9 -Will need to Monitor Blood Sugars [place] -If Consistently elevated will need to place on Sensitive Novolog SSI AC  Normocytic Anemia -Patient's Hb/Hct went from 12.4/38.9 on admission and is now 10.1/30.9 -? Hemoconcentration on Admission -Check Anemia Panel in the AM -Continue to Monitor for S/Sx of Bleeding; Currently no Overt Bleeding Noted -Repeat CBC In AM   DVT prophylaxis: Enoxaparin 40 mg sq q24h Code Status: FULL CODE Family Communication: No family present at bedside but updated them over phone (daughters and Husband) Disposition Plan: Remain Inpatient for Continued workup and ERCP in AM   Consultants:   Gastroenterology  General Surgery   Procedures:  ECHOCARDIOGRAM IMPRESSIONS    1. The left ventricle has normal systolic  function with an ejection fraction of 60-65%. The cavity size was normal. Left ventricular diastolic Doppler parameters are consistent with impaired relaxation. No evidence of left ventricular regional wall  motion abnormalities.  2. The right ventricle has normal systolic function. The cavity was normal. There is no increase in right ventricular wall thickness.  3. The aortic valve is tricuspid. Aortic valve regurgitation is mild by color flow Doppler. Mild aortic  annular calcification noted.  4. The mitral valve is grossly normal.  5. The tricuspid valve is grossly normal. There is mild tricuspid regurgitation.  6. The aortic root is normal in size and structure.  FINDINGS  Left Ventricle: The left ventricle has normal systolic function, with an ejection fraction of 60-65%. The cavity size was normal. There is no increase in left ventricular wall thickness. Left ventricular diastolic Doppler parameters are consistent with  impaired relaxation. No evidence of left ventricular regional wall motion abnormalities..  Right Ventricle: The right ventricle has normal systolic function. The cavity was normal. There is no increase in right ventricular wall thickness.  Left Atrium: Left atrial size was normal in size.  Right Atrium: Right atrial size was normal in size. Right atrial pressure is estimated at 3 mmHg.  Interatrial Septum: No atrial level shunt detected by color flow Doppler.  Pericardium: There is no evidence of pericardial effusion.  Mitral Valve: The mitral valve is grossly normal. Mitral valve regurgitation is trivial by color flow Doppler.  Tricuspid Valve: The tricuspid valve is grossly normal. Tricuspid valve regurgitation is mild by color flow Doppler.  Aortic Valve: The aortic valve is tricuspid Aortic valve regurgitation is mild by color flow Doppler. Mild aortic annular calcification noted.  Pulmonic Valve: The pulmonic valve was grossly normal. Pulmonic valve regurgitation is trivial by color flow Doppler.  Aorta: The aortic root is normal in size and structure.    +--------------+--------++  LEFT VENTRICLE                 +----------------+---------++ +--------------+--------++       Diastology                    PLAX 2D                        +----------------+---------++ +--------------+--------++       LV e' lateral:   7.94 cm/s    LVIDd:         4.11 cm          +----------------+---------++ +--------------+--------++       LV E/e' lateral: 9.4          LVIDs:         2.58 cm         +----------------+---------++ +--------------+--------++       LV e' medial:    6.53 cm/s    LV PW:         0.81 cm         +----------------+---------++ +--------------+--------++       LV E/e' medial:  11.5         LV IVS:        0.80 cm         +----------------+---------++ +--------------+--------++  LVOT diam:     1.70 cm    +--------------+--------++  LV SV:         51 ml      +--------------+--------++  LV SV Index:   27.80      +--------------+--------++  LVOT Area:     2.27 cm   +--------------+--------++                            +--------------+--------++   +------------------+---------++  LV Volumes (MOD)               +------------------+---------++  LV area d, A2C:    18.10 cm   +------------------+---------++  LV area d, A4C:    17.40 cm   +------------------+---------++  LV area s, A2C:    8.56 cm    +------------------+---------++  LV area s, A4C:    9.48 cm    +------------------+---------++  LV major d, A2C:   6.82 cm     +------------------+---------++  LV major d, A4C:   6.39 cm     +------------------+---------++  LV major s, A2C:   4.45 cm     +------------------+---------++  LV major s, A4C:   4.73 cm     +------------------+---------++  LV vol d, MOD A2C: 40.2 ml     +------------------+---------++  LV vol d, MOD A4C: 38.8 ml     +------------------+---------++  LV vol s, MOD A2C: 15.2 ml     +------------------+---------++  LV vol s, MOD A4C: 16.0 ml     +------------------+---------++  LV SV MOD A2C:     25.0 ml     +------------------+---------++  LV SV MOD A4C:     38.8 ml     +------------------+---------++  LV SV MOD BP:      24.8 ml     +------------------+---------++  +---------------+----------++  RIGHT VENTRICLE              +---------------+----------++  RV S prime:     17.90  cm/s   +---------------+----------++  TAPSE (M-mode): 2.4 cm       +---------------+----------++  +---------------+-------++-----------++  LEFT ATRIUM              Index         +---------------+-------++-----------++  LA diam:        3.20 cm  1.81 cm/m    +---------------+-------++-----------++  LA Vol (A2C):   30.4 ml  17.17 ml/m   +---------------+-------++-----------++  LA Vol (A4C):   43.6 ml  24.63 ml/m   +---------------+-------++-----------++  LA Biplane Vol: 36.9 ml  20.85 ml/m   +---------------+-------++-----------++ +------------+---------++-----------++  RIGHT ATRIUM            Index         +------------+---------++-----------++  RA Area:     17.60 cm                +------------+---------++-----------++  RA Volume:   43.30 ml   24.46 ml/m   +------------+---------++-----------++  +------------------+------------++  AORTIC VALVE                      +------------------+------------++  AV Area (Vmax):    1.60 cm       +------------------+------------++  AV Area (Vmean):   1.64 cm       +------------------+------------++  AV Area (VTI):     1.71 cm       +------------------+------------++  AV Vmax:           183.00 cm/s    +------------------+------------++  AV Vmean:          125.000 cm/s   +------------------+------------++  AV VTI:            0.403 m        +------------------+------------++  AV Peak Grad:      13.4 mmHg      +------------------+------------++  AV Mean Grad:      7.0 mmHg       +------------------+------------++  LVOT Vmax:         129.00 cm/s    +------------------+------------++  LVOT Vmean:        90.200 cm/s    +------------------+------------++  LVOT VTI:          0.304 m        +------------------+------------++  LVOT/AV VTI ratio: 0.75           +------------------+------------++  AR PHT:            479 msec       +------------------+------------++   +-------------+-------++  AORTA                    +-------------+-------++  Ao Root diam: 3.20 cm   +-------------+-------++  +--------------+----------++  +---------------+-----------++  MITRAL VALVE                  TRICUSPID VALVE               +--------------+----------++  +---------------+-----------++  MV Area (PHT): 3.21 cm       TR Peak grad:   34.1 mmHg     +--------------+----------++  +---------------+-----------++  MV PHT:        68.44 msec     TR Vmax:        292.00 cm/s   +--------------+----------++  +---------------+-----------++  MV Decel Time: 236 msec     +--------------+----------++  +--------------+-------+ +--------------+-----------++  SHUNTS                   MV E velocity: 74.80 cm/s    +--------------+-------+ +--------------+-----------++  Systemic VTI:  0.30 m    MV Bridges velocity: 112.00 cm/s   +--------------+-------+ +--------------+-----------++  Systemic Diam: 1.70 cm   MV E/Bridges ratio:  0.67          +--------------+-------+ +--------------+-----------++  Antimicrobials:  Anti-infectives (From admission, onward)   None     Subjective: Seen and examined at bedside and states that she had just been urinating quite frequently after receiving Lasix this morning.  No nausea or vomiting.  States abdominal pain is stable.  No longer feeling short of breath and feels "fine".  Was disappointed that her ERCP was not able to be done today.  No other concerns or plans at this time  Objective: Vitals:   11/30/18 1830 11/30/18 2116 12/01/18 0609 12/01/18 1422  BP: (!) 143/65 (!) 133/52 (!) 149/72 (!) 141/66  Pulse:  62 72 71  Resp: (!) 22 18 18 16   Temp:  98.2 F (36.8 C) 98.2 F (36.8 C) 98.6 F (37 C)  TempSrc:  Oral Oral Oral  SpO2:  93% 91% 92%  Weight:  71.7 kg    Height:       No intake or output data in the 24 hours ending 12/01/18 1753 Filed Weights   11/30/18 1601 11/30/18 2116  Weight: 67.6 kg 71.7 kg   Examination: Physical Exam:  Constitutional: WN/WD overweight Caucasian female in  NAD and appears calm and comfortable Eyes: Lids and conjunctivae normal, sclerae anicteric  ENMT: External Ears, Nose appear normal. Grossly normal hearing. Mucous membranes are moist. Neck: Appears normal, supple, no cervical masses, normal ROM, no appreciable thyromegaly; no JVD Respiratory: Diminished to auscultation bilaterally with slightly coarse breath sounds and minimal crackles at the bases;  No appreciable wheezing, rales, rhonch. Normal respiratory effort and patient is not tachypenic. No accessory muscle use. Not wearing supplemental O2 via [place] Cardiovascular: RRR, no murmurs / rubs / gallops. S1 and S2 auscultated. Trace LE edema  Abdomen: Soft, non-tender, Mildly distended. No masses palpated. No appreciable hepatosplenomegaly. Bowel sounds positive x4. Abdominal Incisions appear C/D/I  GU: Deferred. Musculoskeletal: No clubbing / cyanosis of digits/nails. No joint deformity upper and lower extremities.  Skin: No rashes, lesions, ulcers on Bridges limited skin evaluation. No induration; Warm and dry.  Neurologic: CN 2-12 grossly intact with no focal deficits. Romberg sign and cerebellar reflexes not assessed.  Psychiatric: Normal judgment and insight. Alert and oriented x 3. Normal mood and appropriate affect.   Data Reviewed: I have personally reviewed following labs and imaging studies  CBC: Recent Labs  Lab 11/26/18 0437 11/27/18 0409 11/30/18 1625 12/01/18 0402  WBC 19.8* 13.4* 8.8 9.5  NEUTROABS 15.7* 10.8* 4.9  --   HGB 12.6 11.3* 12.4 10.1*  HCT 40.6 37.3 38.9 30.9*  MCV 87.7 89.2 85.1 83.5  PLT 331 305 389 347   Basic Metabolic Panel: Recent Labs  Lab 11/26/18 0437 11/27/18 0409 11/30/18 1625 12/01/18 0402  NA 136 138 139 137  K 4.2 4.4 3.7 3.8  CL 105 108 102 105  CO2 GLUCOSE 92 86 127* 95  BUN 7*  CREATININE 0.75 0.90 0.72 0.58  CALCIUM 8.3* 7.9* 8.9 8.1*  MG  --  1.9  --   --   PHOS  --  1.6*  --   --    GFR: Estimated Creatinine  Clearance: 57.2 mL/min (by C-G formula based on SCr of 0.58 mg/dL). Liver Function Tests: Recent Labs  Lab 11/26/18 0437 11/27/18 0409 11/30/18 1625 12/01/18 0402  AST 17 47* 136* 67*  ALT 13 26 116* 75*  ALKPHOS 46 150* 425* 299*  BILITOT 0.7 1.2 0.8 0.8  PROT 5.7* 5.1* 6.5 5.3*  ALBUMIN 2.7* 2.3* 2.7* 2.2*   Recent Labs  Lab 11/26/18 0437 11/27/18 0409 12/01/18 0806  LIPASE 21  --   --   AMYLASE  --  138* 63   No results for input(s): AMMONIA in the last 168 hours. Coagulation Profile: Recent Labs  Lab 11/26/18 0437  INR 1.3*   Cardiac Enzymes: Recent Labs  Lab 11/30/18 1625 11/30/18 2033 12/01/18 0402 12/01/18 0801  CKTOTAL  --  156  --   --   CKMB  --  1.9  --   --   TROPONINI 0.05* 0.05* 0.04* 0.04*   BNP (last 3 results) No results for input(s): PROBNP in the last 8760 hours. HbA1C: Recent Labs    12/01/18 0402  HGBA1C 5.9*   CBG: No results for input(s): GLUCAP in the last 168 hours. Lipid Profile: No results for input(s): CHOL, HDL, LDLCALC, TRIG, CHOLHDL, LDLDIRECT in the last 72 hours. Thyroid Function Tests: No results for input(s): TSH, T4TOTAL, FREET4, T3FREE, THYROIDAB in the last 72 hours. Anemia Panel: No results for input(s): VITAMINB12, FOLATE, FERRITIN, TIBC, IRON, RETICCTPCT in the last 72 hours. Sepsis Labs: No results for input(s): PROCALCITON, LATICACIDVEN in the last 168 hours.  Recent Results (from the past 240 hour(s))  SARS Coronavirus 2 (CEPHEID - Performed in [facility] hospital lab), Hosp Order     Status: None   Collection Time: 11/25/18  7:26 PM  Result Value Ref Range Status   SARS Coronavirus 2 NEGATIVE NEGATIVE Final  Comment: (NOTE) If result is NEGATIVE SARS-CoV-2 target nucleic acids are NOT DETECTED. The SARS-CoV-2 RNA is generally detectable in upper and lower  respiratory specimens during the acute phase of infection. The lowest  concentration of SARS-CoV-2 viral copies this assay can detect is 250   copies / mL. Bridges negative result does not preclude SARS-CoV-2 infection  and should not be used as the sole basis for treatment or other  patient management decisions.  Bridges negative result may occur with  improper specimen collection / handling, submission of specimen other  than nasopharyngeal swab, presence of viral mutation(s) within the  areas targeted by this assay, and inadequate number of viral copies  (<250 copies / mL). Bridges negative result must be combined with clinical  observations, patient history, and epidemiological information. If result is POSITIVE SARS-CoV-2 target nucleic acids are DETECTED. The SARS-CoV-2 RNA is generally detectable in upper and lower  respiratory specimens dur ing the acute phase of infection.  Positive  results are indicative of active infection with SARS-CoV-2.  Clinical  correlation with patient history and other diagnostic information is  necessary to determine patient infection status.  Positive results do  not rule out bacterial infection or co-infection with other viruses. If result is PRESUMPTIVE POSTIVE SARS-CoV-2 nucleic acids MAY BE PRESENT.   Bridges presumptive positive result was obtained on the submitted specimen  and confirmed on repeat testing.  While 2019 novel coronavirus  (SARS-CoV-2) nucleic acids may be present in the submitted sample  additional confirmatory testing may be necessary for epidemiological  and / or clinical management purposes  to differentiate between  SARS-CoV-2 and other Sarbecovirus currently known to infect humans.  If clinically indicated additional testing with an alternate test  methodology [phone]) is advised. The SARS-CoV-2 RNA is generally  detectable in upper and lower respiratory sp ecimens during the acute  phase of infection. The expected result is Negative. Fact Sheet for Patients:  BoilerBrush.com.cy Fact Sheet for Healthcare  Providers: https://pope.com/ This test is not yet approved or cleared by the [place] FDA and has been authorized for detection and/or diagnosis of SARS-CoV-2 by FDA under an Emergency Use Authorization (EUA).  This EUA will remain in effect (meaning this test can be used) for the duration of the COVID-19 declaration under Section 564(b)(1) of the Act, 21 U.S.C. section 360bbb-3(b)(1), unless the authorization is terminated or revoked sooner. Performed at [facility], [address] [place], [place] [address]   Surgical PCR screen     Status: None   Collection Time: 11/27/18 12:50 AM  Result Value Ref Range Status   MRSA, PCR NEGATIVE NEGATIVE Final   Staphylococcus aureus NEGATIVE NEGATIVE Final    Comment: (NOTE) The Xpert SA Assay (FDA approved for NASAL specimens in patients 37 years of age and older), is one component of Bridges comprehensive surveillance program. It is not intended to diagnose infection nor to guide or monitor treatment. Performed at [facility], [address], [place], [place] [address]   SARS Coronavirus 2 (CEPHEID- Performed in [facility] hospital lab), Hosp Order     Status: None   Collection Time: 11/30/18  6:24 PM  Result Value Ref Range Status   SARS Coronavirus 2 NEGATIVE NEGATIVE Final    Comment: (NOTE) If result is NEGATIVE SARS-CoV-2 target nucleic acids are NOT DETECTED. The SARS-CoV-2 RNA is generally detectable in upper and lower  respiratory specimens during the acute phase of infection. The lowest  concentration of SARS-CoV-2 viral copies this assay can  detect is 250  copies / mL. Bridges negative result does not preclude SARS-CoV-2 infection  and should not be used as the sole basis for treatment or other  patient management decisions.  Bridges negative result may occur with  improper specimen collection / handling, submission of specimen other  than nasopharyngeal swab, presence of viral mutation(s) within the  areas  targeted by this assay, and inadequate number of viral copies  (<250 copies / mL). Bridges negative result must be combined with clinical  observations, patient history, and epidemiological information. If result is POSITIVE SARS-CoV-2 target nucleic acids are DETECTED. The SARS-CoV-2 RNA is generally detectable in upper and lower  respiratory specimens dur ing the acute phase of infection.  Positive  results are indicative of active infection with SARS-CoV-2.  Clinical  correlation with patient history and other diagnostic information is  necessary to determine patient infection status.  Positive results do  not rule out bacterial infection or co-infection with other viruses. If result is PRESUMPTIVE POSTIVE SARS-CoV-2 nucleic acids MAY BE PRESENT.   Bridges presumptive positive result was obtained on the submitted specimen  and confirmed on repeat testing.  While 2019 novel coronavirus  (SARS-CoV-2) nucleic acids may be present in the submitted sample  additional confirmatory testing may be necessary for epidemiological  and / or clinical management purposes  to differentiate between  SARS-CoV-2 and other Sarbecovirus currently known to infect humans.  If clinically indicated additional testing with an alternate test  methodology [phone]) is advised. The SARS-CoV-2 RNA is generally  detectable in upper and lower respiratory sp ecimens during the acute  phase of infection. The expected result is Negative. Fact Sheet for Patients:  StrictlyIdeas.no Fact Sheet for Healthcare Providers: BankingDealers.co.za This test is not yet approved or cleared by the [place] FDA and has been authorized for detection and/or diagnosis of SARS-CoV-2 by FDA under an Emergency Use Authorization (EUA).  This EUA will remain in effect (meaning this test can be used) for the duration of the COVID-19 declaration under Section 564(b)(1) of the Act, 21 U.S.C. section  360bbb-3(b)(1), unless the authorization is terminated or revoked sooner. Performed at [facility], [address] [place], [place] [address]     Radiology Studies: Dg Chest 2 View  Result Date: 11/30/2018 CLINICAL DATA:  Shortness of breath and hypoxia. EXAM: CHEST - 2 VIEW COMPARISON:  None. FINDINGS: Lungs are adequately inflated demonstrate small bilateral pleural effusions likely with associated bibasilar atelectasis. Minimal prominence of the perihilar vessels suggesting mild degree of vascular congestion. Cardiomediastinal silhouette is unremarkable. There are degenerative changes of the spine. IMPRESSION: Suggestion of mild vascular congestion. Small bilateral pleural effusions with associated bibasilar atelectasis. Infection in the lung bases is possible. Electronically Signed   By: Marin Olp M.D.   On: 11/30/2018 17:02   Ct Angio Chest Pe W And/or Wo Contrast  Result Date: 11/30/2018 CLINICAL DATA:  Hypoxia, recent cholecystectomy EXAM: CT ANGIOGRAPHY CHEST WITH CONTRAST TECHNIQUE: Multidetector CT imaging of the chest was performed using the standard protocol during bolus administration of intravenous contrast. Multiplanar CT image reconstructions and MIPs were obtained to evaluate the vascular anatomy. CONTRAST:  <See Chart> OMNIPAQUE IOHEXOL 350 MG/ML SOLN COMPARISON:  Chest radiograph, 11/30/2018 FINDINGS: Cardiovascular: Satisfactory opacification of the pulmonary arteries to the segmental level. No evidence of pulmonary embolism. Aortic atherosclerosis. Normal heart size. Three-vessel coronary artery calcifications. No pericardial effusion. Mediastinum/Nodes: No enlarged mediastinal, hilar, or axillary lymph nodes. Thyroid gland, trachea, and esophagus demonstrate no significant findings. There  is Bridges fat containing left-sided Bochdalek's type hernia. Lungs/Pleura: Small bilateral pleural effusions, right greater than left, with associated atelectasis or consolidation. Underlying  moderate centrilobular emphysema. Upper Abdomen: No acute abnormality. Musculoskeletal: Scattered subcutaneous emphysema about the right chest wall, likely related to recent laparoscopic surgery. No acute or significant osseous findings. Review of the MIP images confirms the above findings. IMPRESSION: 1.  Negative examination for pulmonary embolism. 2. Small, right greater than left pleural effusions with associated atelectasis or consolidation. 3.  Emphysema. 4.  Coronary artery disease. 5. Scattered subcutaneous emphysema about the right chest wall, likely related to recent laparoscopic surgery. Electronically Signed   By: Lauralyn Primes M.D.   On: 11/30/2018 19:05   Scheduled Meds:  docusate sodium  100 mg Oral BID   enoxaparin (LOVENOX) injection  40 mg Subcutaneous Q24H   furosemide  40 mg Intravenous Once   levothyroxine  88 mcg Oral Q0600   nicotine  21 mg Transdermal Daily   sodium chloride flush  3 mL Intravenous Q12H   Continuous Infusions:  sodium chloride      LOS: 1 day   Merlene Laughter, DO [facility] PAGER is on AMION  If 7PM-7AM, please contact night-coverage [email or web address] Password [facility] 12/01/2018, 5:53 PM

## 2018-12-01 NOTE — Progress Notes (Signed)
*  PRELIMINARY RESULTS* Echocardiogram 2D Echocardiogram has been performed.  Samuel Germany 12/01/2018, 12:55 PM

## 2018-12-01 NOTE — Progress Notes (Signed)
  Patient's evaluation has been completed. Serum lipase 38. Echo shows mild AI and good LV function but she does have diastolic dysfunction. No peak or bump in troponin levels felt to be due to mild CHF or demand ischemia. Discussed with Dr. Alfredia Ferguson feels patient is stable to proceed with ERCP tomorrow. I also checked room air O2 sat and it ranged between 93 and 94%. Patient is agreeable to proceed with ERCP with biliary sphincterotomy and stone extraction tomorrow.

## 2018-12-01 NOTE — Progress Notes (Signed)
[facility] Progress Note     Subjective: Doing fair. No complaints. Ate lunch. ECHO just done.  Not on O2.   Objective: Vital signs in last 24 hours: Temp:  [98 F (36.7 C)-98.2 F (36.8 C)] 98.2 F (36.8 C) (06/08 0609) Pulse Rate:  [62-84] 72 (06/08 0609) Resp:  [18-30] 18 (06/08 0609) BP: (128-151)/(52-72) 149/72 (06/08 0609) SpO2:  [91 %-95 %] 91 % (06/08 0609) Weight:  [67.6 kg-71.7 kg] 71.7 kg (06/07 2116)    General appearance: alert, cooperative and no distress Resp: normal work of breathing GI: soft, nondistended, nontender, port site with dermabond and no erythema or drainage  Lab Results:  Recent Labs    11/30/18 1625 12/01/18 0402  WBC 8.8 9.5  HGB 12.4 10.1*  HCT 38.9 30.9*  PLT 389 347   BMET Recent Labs    11/30/18 1625 12/01/18 0402  NA 139 137  K 3.7 3.8  CL 102 105  CO2 23 25  GLUCOSE 127* 95  BUN 8 7*  CREATININE 0.72 0.58  CALCIUM 8.9 8.1*    Personally reviewed- gallbladder clips in place, small right effusion and minor atelectasis   Studies/Results: Dg Chest 2 View  Result Date: 11/30/2018 CLINICAL DATA:  Shortness of breath and hypoxia. EXAM: CHEST - 2 VIEW COMPARISON:  None. FINDINGS: Lungs are adequately inflated demonstrate small bilateral pleural effusions likely with associated bibasilar atelectasis. Minimal prominence of the perihilar vessels suggesting mild degree of vascular congestion. Cardiomediastinal silhouette is unremarkable. There are degenerative changes of the spine. IMPRESSION: Suggestion of mild vascular congestion. Small bilateral pleural effusions with associated bibasilar atelectasis. Infection in the lung bases is possible. Electronically Signed   By: Elberta Fortisaniel  Boyle M.D.   On: 11/30/2018 17:02   Ct Angio Chest Pe W And/or Wo Contrast  Result Date: 11/30/2018 CLINICAL DATA:  Hypoxia, recent cholecystectomy EXAM: CT ANGIOGRAPHY CHEST WITH CONTRAST TECHNIQUE: Multidetector CT imaging of the chest was  performed using the standard protocol during bolus administration of intravenous contrast. Multiplanar CT image reconstructions and MIPs were obtained to evaluate the vascular anatomy. CONTRAST:  <See Chart> OMNIPAQUE IOHEXOL 350 MG/ML SOLN COMPARISON:  Chest radiograph, 11/30/2018 FINDINGS: Cardiovascular: Satisfactory opacification of the pulmonary arteries to the segmental level. No evidence of pulmonary embolism. Aortic atherosclerosis. Normal heart size. Three-vessel coronary artery calcifications. No pericardial effusion. Mediastinum/Nodes: No enlarged mediastinal, hilar, or axillary lymph nodes. Thyroid gland, trachea, and esophagus demonstrate no significant findings. There is a fat containing left-sided Bochdalek's type hernia. Lungs/Pleura: Small bilateral pleural effusions, right greater than left, with associated atelectasis or consolidation. Underlying moderate centrilobular emphysema. Upper Abdomen: No acute abnormality. Musculoskeletal: Scattered subcutaneous emphysema about the right chest wall, likely related to recent laparoscopic surgery. No acute or significant osseous findings. Review of the MIP images confirms the above findings. IMPRESSION: 1.  Negative examination for pulmonary embolism. 2. Small, right greater than left pleural effusions with associated atelectasis or consolidation. 3.  Emphysema. 4.  Coronary artery disease. 5. Scattered subcutaneous emphysema about the right chest wall, likely related to recent laparoscopic surgery. Electronically Signed   By: Lauralyn PrimesAlex  Bibbey M.D.   On: 11/30/2018 19:05    Anti-infectives: Anti-infectives (From admission, onward)   None      Assessment/Plan: Sherry Bridges is a 78 yo POD 4 s/p Lap chole and POD 5 s/p ERCP with plans for repeat due to retained CBD stones. Patient admitted with desaturations yesterday at home and troponin elevation. Being worked up with ECHO currently.  Tolerating diet.  LFTs coming down, likely just post op and trending  down. T bili normal.   -Dr. Laural Golden planning for ERCP once patient is medically stable. -ECHO pending -No surgical intervention at this time, diet until needs to be NPO -Trend LFTs     LOS: 1 day    Virl Cagey 12/01/2018

## 2018-12-02 ENCOUNTER — Inpatient Hospital Stay (HOSPITAL_COMMUNITY): Payer: Medicare Other | Admitting: Anesthesiology

## 2018-12-02 ENCOUNTER — Inpatient Hospital Stay (HOSPITAL_COMMUNITY): Payer: Medicare Other

## 2018-12-02 ENCOUNTER — Encounter (HOSPITAL_COMMUNITY): Payer: Self-pay | Admitting: Student

## 2018-12-02 ENCOUNTER — Encounter (HOSPITAL_COMMUNITY)
Admission: EM | Disposition: A | Discharge status: Home / Self Care | Payer: Self-pay | Source: Home / Self Care | Attending: Internal Medicine

## 2018-12-02 ENCOUNTER — Other Ambulatory Visit: Payer: Self-pay

## 2018-12-02 DIAGNOSIS — I2510 Atherosclerotic heart disease of native coronary artery without angina pectoris: Secondary | ICD-10-CM

## 2018-12-02 DIAGNOSIS — R7989 Other specified abnormal findings of blood chemistry: Secondary | ICD-10-CM

## 2018-12-02 DIAGNOSIS — I5031 Acute diastolic (congestive) heart failure: Secondary | ICD-10-CM

## 2018-12-02 HISTORY — PX: REMOVAL OF STONES: SHX5545

## 2018-12-02 HISTORY — PX: BILIARY STENT PLACEMENT: SHX5538

## 2018-12-02 HISTORY — PX: ERCP: SHX5425

## 2018-12-02 HISTORY — PX: SPHINCTEROTOMY: SHX5544

## 2018-12-02 LAB — RETICULOCYTES
Immature Retic Fract: 32.8 % — ABNORMAL HIGH (ref 2.3–15.9)
RBC.: 4.26 MIL/uL (ref 3.87–5.11)
Retic Count, Absolute: 114.6 10*3/uL (ref 19.0–186.0)
Retic Ct Pct: 2.7 % (ref 0.4–3.1)

## 2018-12-02 LAB — HEPATIC FUNCTION PANEL
ALT: 61 U/L — ABNORMAL HIGH (ref 0–44)
AST: 37 U/L (ref 15–41)
Albumin: 2.6 g/dL — ABNORMAL LOW (ref 3.5–5.0)
Alkaline Phosphatase: 279 U/L — ABNORMAL HIGH (ref 38–126)
Bilirubin, Direct: 0.2 mg/dL (ref 0.0–0.2)
Indirect Bilirubin: 0.7 mg/dL (ref 0.3–0.9)
Total Bilirubin: 0.9 mg/dL (ref 0.3–1.2)
Total Protein: 6.2 g/dL — ABNORMAL LOW (ref 6.5–8.1)

## 2018-12-02 LAB — COMPREHENSIVE METABOLIC PANEL
ALT: 61 U/L — ABNORMAL HIGH (ref 0–44)
AST: 37 U/L (ref 15–41)
Albumin: 2.6 g/dL — ABNORMAL LOW (ref 3.5–5.0)
Alkaline Phosphatase: 272 U/L — ABNORMAL HIGH (ref 38–126)
Anion gap: 10 (ref 5–15)
BUN: 8 mg/dL (ref 8–23)
CO2: 28 mmol/L (ref 22–32)
Calcium: 8.6 mg/dL — ABNORMAL LOW (ref 8.9–10.3)
Chloride: 98 mmol/L (ref 98–111)
Creatinine, Ser: 0.72 mg/dL (ref 0.44–1.00)
GFR calc Af Amer: >60 mL/min (ref >60)
GFR calc non Af Amer: >60 mL/min (ref >60)
Glucose, Bld: 103 mg/dL — ABNORMAL HIGH (ref 70–99)
Potassium: 3.4 mmol/L — ABNORMAL LOW (ref 3.5–5.1)
Sodium: 136 mmol/L (ref 135–145)
Total Bilirubin: 1.1 mg/dL (ref 0.3–1.2)
Total Protein: 6.2 g/dL — ABNORMAL LOW (ref 6.5–8.1)

## 2018-12-02 LAB — CBC WITH DIFFERENTIAL/PLATELET
Abs Immature Granulocytes: 0.38 10*3/uL — ABNORMAL HIGH (ref 0.00–0.07)
Basophils Absolute: 0.1 10*3/uL (ref 0.0–0.1)
Basophils Relative: 1 %
Eosinophils Absolute: 0.1 10*3/uL (ref 0.0–0.5)
Eosinophils Relative: 1 %
HCT: 36 % (ref 36.0–46.0)
Hemoglobin: 11.6 g/dL — ABNORMAL LOW (ref 12.0–15.0)
Immature Granulocytes: 4 %
Lymphocytes Relative: 28 %
Lymphs Abs: 3 10*3/uL (ref 0.7–4.0)
MCH: 27.2 pg (ref 26.0–34.0)
MCHC: 32.2 g/dL (ref 30.0–36.0)
MCV: 84.5 fL (ref 80.0–100.0)
Monocytes Absolute: 1.4 10*3/uL — ABNORMAL HIGH (ref 0.1–1.0)
Monocytes Relative: 13 %
Neutro Abs: 5.6 10*3/uL (ref 1.7–7.7)
Neutrophils Relative %: 53 %
Platelets: 418 10*3/uL — ABNORMAL HIGH (ref 150–400)
RBC: 4.26 MIL/uL (ref 3.87–5.11)
RDW: 14.2 % (ref 11.5–15.5)
WBC: 10.6 10*3/uL — ABNORMAL HIGH (ref 4.0–10.5)
nRBC: 0.3 % — ABNORMAL HIGH (ref 0.0–0.2)

## 2018-12-02 LAB — PHOSPHORUS: Phosphorus: 3.9 mg/dL (ref 2.5–4.6)

## 2018-12-02 LAB — HEPATITIS PANEL, ACUTE
HCV Ab: <0.1 s/co ratio (ref 0.0–0.9)
Hep A IgM: NEGATIVE
Hep B C IgM: NEGATIVE
Hepatitis B Surface Ag: NEGATIVE

## 2018-12-02 LAB — FOLATE: Folate: 15.3 ng/mL (ref >5.9)

## 2018-12-02 LAB — FERRITIN: Ferritin: 96 ng/mL (ref 11–307)

## 2018-12-02 LAB — MAGNESIUM: Magnesium: 1.9 mg/dL (ref 1.7–2.4)

## 2018-12-02 LAB — IRON AND TIBC
Iron: 27 ug/dL — ABNORMAL LOW (ref 28–170)
Saturation Ratios: 9 % — ABNORMAL LOW (ref 10.4–31.8)
TIBC: 313 ug/dL (ref 250–450)
UIBC: 286 ug/dL

## 2018-12-02 LAB — VITAMIN B12: Vitamin B-12: 55 pg/mL — ABNORMAL LOW (ref 180–914)

## 2018-12-02 SURGERY — ERCP, WITH INTERVENTION IF INDICATED
Anesthesia: General

## 2018-12-02 MED ORDER — CYANOCOBALAMIN 1000 MCG/ML IJ SOLN
1000.0000 ug | Freq: Once | INTRAMUSCULAR | Status: AC
  Administered 2018-12-02: 1000 ug via INTRAMUSCULAR
  Filled 2018-12-02: qty 1

## 2018-12-02 MED ORDER — POTASSIUM CHLORIDE 10 MEQ/100ML IV SOLN
10.0000 meq | INTRAVENOUS | Status: AC
  Administered 2018-12-02 (×4): 10 meq via INTRAVENOUS

## 2018-12-02 MED ORDER — SUCCINYLCHOLINE CHLORIDE 20 MG/ML IJ SOLN
INTRAMUSCULAR | Status: DC | PRN
  Administered 2018-12-02: 80 mg via INTRAVENOUS

## 2018-12-02 MED ORDER — FUROSEMIDE 10 MG/ML IJ SOLN
40.0000 mg | Freq: Once | INTRAMUSCULAR | Status: AC
  Administered 2018-12-02: 40 mg via INTRAVENOUS
  Filled 2018-12-02: qty 4

## 2018-12-02 MED ORDER — HYDROMORPHONE HCL 1 MG/ML IJ SOLN: 0.2500 mg | INTRAMUSCULAR | Status: DC | PRN

## 2018-12-02 MED ORDER — GLYCOPYRROLATE 0.2 MG/ML IJ SOLN
INTRAMUSCULAR | Status: DC | PRN
  Administered 2018-12-02: 0.2 mg via INTRAVENOUS

## 2018-12-02 MED ORDER — SODIUM CHLORIDE 0.9 % IV SOLN
INTRAVENOUS | Status: DC | PRN
  Administered 2018-12-02: 14:00:00 100 mL

## 2018-12-02 MED ORDER — CEFAZOLIN SODIUM-DEXTROSE 2-4 GM/100ML-% IV SOLN
2.0000 g | Freq: Once | INTRAVENOUS | Status: AC
  Administered 2018-12-02: 2 g via INTRAVENOUS
  Filled 2018-12-02: qty 100

## 2018-12-02 MED ORDER — GLYCOPYRROLATE PF 0.2 MG/ML IJ SOSY
PREFILLED_SYRINGE | INTRAMUSCULAR | Status: AC
  Filled 2018-12-02: qty 1

## 2018-12-02 MED ORDER — PROMETHAZINE HCL 25 MG/ML IJ SOLN: 6.2500 mg | INTRAMUSCULAR | Status: DC | PRN

## 2018-12-02 MED ORDER — MIDAZOLAM HCL 2 MG/2ML IJ SOLN: 0.5000 mg | Freq: Once | INTRAMUSCULAR | Status: DC | PRN

## 2018-12-02 MED ORDER — ROCURONIUM BROMIDE 10 MG/ML (PF) SYRINGE
PREFILLED_SYRINGE | INTRAVENOUS | Status: AC
  Filled 2018-12-02: qty 10

## 2018-12-02 MED ORDER — ROCURONIUM BROMIDE 100 MG/10ML IV SOLN
INTRAVENOUS | Status: DC | PRN
  Administered 2018-12-02: 15 mg via INTRAVENOUS

## 2018-12-02 MED ORDER — HYDROCODONE-ACETAMINOPHEN 7.5-325 MG PO TABS: 1.0000 | ORAL_TABLET | Freq: Once | ORAL | Status: DC | PRN

## 2018-12-02 MED ORDER — PROPOFOL 10 MG/ML IV BOLUS
INTRAVENOUS | Status: DC | PRN
  Administered 2018-12-02: 90 mg via INTRAVENOUS

## 2018-12-02 MED ORDER — SODIUM CHLORIDE 0.9 % IV SOLN: INTRAVENOUS | Status: DC

## 2018-12-02 MED ORDER — WATER FOR IRRIGATION, STERILE IR SOLN
Status: DC | PRN
  Administered 2018-12-02: 1000 mL

## 2018-12-02 MED ORDER — MIDAZOLAM HCL 2 MG/2ML IJ SOLN
INTRAMUSCULAR | Status: AC
  Filled 2018-12-02: qty 2

## 2018-12-02 MED ORDER — LACTATED RINGERS IV SOLN
INTRAVENOUS | Status: DC
  Administered 2018-12-02: 12:00:00 via INTRAVENOUS

## 2018-12-02 MED ORDER — SUCCINYLCHOLINE CHLORIDE 200 MG/10ML IV SOSY
PREFILLED_SYRINGE | INTRAVENOUS | Status: AC
  Filled 2018-12-02: qty 10

## 2018-12-02 MED ORDER — GLUCAGON HCL RDNA (DIAGNOSTIC) 1 MG IJ SOLR
INTRAMUSCULAR | Status: AC
  Filled 2018-12-02: qty 2

## 2018-12-02 MED ORDER — POLYSACCHARIDE IRON COMPLEX 150 MG PO CAPS
150.0000 mg | ORAL_CAPSULE | Freq: Every day | ORAL | Status: DC
  Administered 2018-12-03: 150 mg via ORAL
  Filled 2018-12-02 (×2): qty 1

## 2018-12-02 MED ORDER — GLUCAGON HCL RDNA (DIAGNOSTIC) 1 MG IJ SOLR
INTRAMUSCULAR | Status: DC | PRN
  Administered 2018-12-02: 0.25 mg via INTRAVENOUS

## 2018-12-02 MED ORDER — SODIUM CHLORIDE 0.9 % IV SOLN
INTRAVENOUS | Status: AC
  Filled 2018-12-02: qty 50

## 2018-12-02 MED ORDER — VITAMIN B-12 1000 MCG PO TABS
1000.0000 ug | ORAL_TABLET | Freq: Every day | ORAL | Status: DC
  Administered 2018-12-03: 1000 ug via ORAL
  Filled 2018-12-02: qty 1

## 2018-12-02 NOTE — Anesthesia Procedure Notes (Signed)
Procedure Name: Intubation Date/Time: 12/02/2018 1:31 PM Performed by: Charmaine Downs, CRNA Pre-anesthesia Checklist: Emergency Drugs available, Patient identified, Suction available and Patient being monitored Patient Re-evaluated:Patient Re-evaluated prior to induction Oxygen Delivery Method: Circle system utilized Preoxygenation: Pre-oxygenation with 100% oxygen Induction Type: IV induction, Cricoid Pressure applied and Rapid sequence Laryngoscope Size: Mac and 3 Grade View: Grade I Tube size: 7.0 mm Number of attempts: 1 Airway Equipment and Method: Stylet Placement Confirmation: ETT inserted through vocal cords under direct vision,  positive ETCO2 and breath sounds checked- equal and bilateral Secured at: 22 cm Tube secured with: Tape Dental Injury: Teeth and Oropharynx as per pre-operative assessment

## 2018-12-02 NOTE — Consult Note (Addendum)
Cardiology Consult    Patient ID: Sherry Bridges; [id number]; 1940-10-24   Admit date: 11/30/2018 Date of Consult: 12/02/2018  Primary Care Provider: Neale Burly, MD Primary Cardiologist: New to [facility] - Dr. Domenic Polite  Patient Profile    Sherry Bridges is a 78 y.o. female with past medical history of HLD, COPD, and hypothyroidism who is being seen today for the evaluation of CHF and Coronary Calcifications by CT Imaging at the request of Dr. Alfredia Ferguson.  History of Present Illness    Sherry Bridges was most recently admitted to [facility] from 11/25/2018 to 11/27/2018 for evaluation of nausea and vomiting.  Reported associated right upper quadrant pain. She initially underwent ERCP (pancreatic stent placed) and required laparoscopic cholecystectomy on 11/27/2018.  Presented back to [facility] ED on 11/30/2018 for evaluation of worsening dyspnea and epigastric pain. Was found to be mildly hypoxic at 93% on RA. She reports having dyspnea on exertion and edema starting the day after hospital discharge. Denies any associated chest pain or palpitations. Had been sleeping on 2-pillows which was unusual for her but reports she felt this was due to not being able to sleep on her abdomen due to her recent surgery. No prior cardiac history for herself but reports her brother had an MI at age 29 and both of her parents had a history of CAD as well (unsure of age of onset).   Initial labs showing WBC 8.8, Hgb 12.4, platelets 389, Na+ 139, K+ 3.7, and creatinine 0.72. AST 136, ALT 116, and Alk Phos 425. D-dimer 4.79. COVID testing negative. Initial and cyclic troponin values flat at 0.05, 0.05, and 0.04. BNP 228. EKG showing NSR, HR 74, with no diagnostic ST abnormalities (no prior tracings available for comparison). CXR showing mild vascular congestion and small bilateral pleural effusions. CTA negative for PE but did show emphysema and coronary artery calcifications.   An echocardiogram was obtained the day  after admission and showed a preserved EF of 60-65% with no regional WMA. GI was consulted at the time of admission and are planning for ERCP later today with biliary sphincterotomy and stone extraction.   She did receive IV Lasix '40mg'$  x2 yesterday yet I&O's have not been recorded. However, weight has declined by 149 lbs on admission to 146 lbs today. Repeat labs show creatinine remains stable at 0.72.   Past Medical History:  Diagnosis Date   Hyperlipidemia    Hypothyroidism 01/02/2017    Past Surgical History:  Procedure Laterality Date   BILIARY STENT PLACEMENT  11/26/2018   Procedure: BILIARY STENT PLACEMENT (PANCREATIC);  Surgeon: Rogene Houston, MD;  Location: [facility];  Service: Endoscopy;;   CHOLECYSTECTOMY N/A 11/27/2018   Procedure: LAPAROSCOPIC CHOLECYSTECTOMY;  Surgeon: Virl Cagey, MD;  Location: [facility] OR[facility];  Service: General;  Laterality: N/A;   ERCP N/A 11/26/2018   Procedure: UNSUCCESSFUL ENDOSCOPIC RETROGRADE CHOLANGIOPANCREATOGRAPHY (ERCP) BECAUSE OF SMALL ORIFICE AND EDEMA;  Surgeon: Rogene Houston, MD;  Location: [facility];  Service: Endoscopy;  Laterality: N/A;   TUBAL LIGATION       Home Medications:  Prior to Admission medications   Medication Sig Start Date End Date Taking? Authorizing Provider  atorvastatin (LIPITOR) 40 MG tablet Take 40 mg by mouth daily at 6 PM.    Yes [provider]  co-enzyme Q-10 30 MG capsule Take 30 mg by mouth daily.    Yes [provider]  docusate sodium (COLACE) 100 MG capsule Take 1 capsule (  100 mg total) by mouth 2 (two) times daily. 11/27/18 11/27/19 Yes Virl Cagey, MD  EUTHYROX 88 MCG tablet Take 88 mcg by mouth daily. 11/19/18  Yes [provider]  Multiple Vitamin (MULTIVITAMIN) tablet Take 1 tablet by mouth daily.   Yes [provider]  naproxen sodium (ALEVE) 220 MG tablet Take 220 mg by mouth daily as needed (FOR PAIN).   Yes [provider]  Omega-3 Fatty Acids  (FISH OIL) 1000 MG CAPS Take 1 capsule by mouth daily.    Yes [provider]  ondansetron (ZOFRAN) 4 MG tablet Take 1 tablet (4 mg total) by mouth daily as needed for nausea or vomiting. 11/27/18 11/27/19 Yes Virl Cagey, MD  oxyCODONE (OXY IR/ROXICODONE) 5 MG immediate release tablet Take 1 tablet (5 mg total) by mouth every 4 (four) hours as needed for moderate pain. Patient taking differently: Take 2.5 mg by mouth at bedtime as needed for moderate pain. Prescribed '5mg'$  every 4 hours as needed for pain/ 11/27/18  Yes Virl Cagey, MD    Inpatient Medications: Scheduled Meds:  cyanocobalamin  1,000 mcg Intramuscular Once   docusate sodium  100 mg Oral BID   guaiFENesin  1,200 mg Oral BID   levothyroxine  88 mcg Oral Q0600   nicotine  21 mg Transdermal Daily   sodium chloride flush  3 mL Intravenous Q12H   umeclidinium-vilanterol  1 puff Inhalation Daily   [START ON 12/03/2018] vitamin B-12  1,000 mcg Oral Daily   Continuous Infusions:  sodium chloride     potassium chloride     PRN Meds: sodium chloride, acetaminophen **OR** acetaminophen, albuterol, hydrALAZINE, ondansetron (ZOFRAN) IV, oxyCODONE, sodium chloride flush, zolpidem  Allergies:   No Known Allergies  Social History:   Social History   Socioeconomic History   Marital status: Married    Spouse name: Not on file   Number of children: Not on file   Years of education: Not on file   Highest education level: Not on file  Occupational History   Not on file  Social Needs   Financial resource strain: Not on file   Food insecurity:    Worry: Not on file    Inability: Not on file   Transportation needs:    Medical: Not on file    Non-medical: Not on file  Tobacco Use   Smoking status: Former Smoker   Smokeless tobacco: Never Used  Substance and Sexual Activity   Alcohol use: Yes    Comment: OCCASIONAL   Drug use: No   Sexual activity: Not on file  Lifestyle   Physical  activity:    Days per week: Not on file    Minutes per session: Not on file   Stress: Not on file  Relationships   Social connections:    Talks on phone: Not on file    Gets together: Not on file    Attends religious service: Not on file    Active member of club or organization: Not on file    Attends meetings of clubs or organizations: Not on file    Relationship status: Not on file   Intimate partner violence:    Fear of current or ex partner: Not on file    Emotionally abused: Not on file    Physically abused: Not on file    Forced sexual activity: Not on file  Other Topics Concern   Not on file  Social History Narrative   Not on file  Family History:    Family History  Problem Relation Age of Onset   CAD Mother    CAD Father    CAD Brother        MI at age 60      Review of Systems    General:  No chills, fever, night sweats or weight changes.  Cardiovascular:  No chest pain, orthopnea, palpitations, paroxysmal nocturnal dyspnea. Positive for edema and dyspnea on exertion.  Dermatological: No rash, lesions/masses Respiratory: No cough, dyspnea Urologic: No hematuria, dysuria Abdominal:   No nausea, vomiting, diarrhea, bright red blood per rectum, melena, or hematemesis Neurologic:  No visual changes, wkns, changes in mental status. All other systems reviewed and are otherwise negative except as noted above.  Physical Exam/Data    Vitals:   12/01/18 1422 12/01/18 1956 12/01/18 2156 12/02/18 0514  BP: (!) 141/66  (!) 142/64 138/66  Pulse: 71  73 68  Resp: 16   16  Temp: 98.6 F (37 C)  98.4 F (36.9 C) 98.6 F (37 C)  TempSrc: Oral  Oral Oral  SpO2: 92% (!) 88% 93% 96%  Weight:    66.4 kg  Height:        Intake/Output Summary (Last 24 hours) at 12/02/2018 0845 Last data filed at 12/02/2018 0655 Gross per 24 hour  Intake 480 ml  Output --  Net 480 ml   Filed Weights   11/30/18 1601 11/30/18 2116 12/02/18 0514  Weight: 67.6 kg 71.7 kg 66.4  kg   Body mass index is 25.13 kg/m.   General: Pleasant, Caucasian female appearing in NAD Psych: Normal affect. Neuro: Alert and oriented X 3. Moves all extremities spontaneously. HEENT: Normal  Neck: Supple without bruits or JVD. Lungs:  Resp regular and unlabored, slightly decreased breath sounds along bases bilaterally. Heart: RRR no s3, s4, or murmurs. Abdomen: Soft, non-tender, non-distended, BS + x 4.  Extremities: No clubbing, cyanosis or edema. DP/PT/Radials 2+ and equal bilaterally.   EKG:  The EKG was personally reviewed and demonstrates: NSR, HR 74, with no diagnostic ST abnormalities (no prior tracings available for comparison).  Telemetry:  Telemetry was personally reviewed and demonstrates:  NSR, HR in 70's to 80's with occasional PAC's. Brief episode of atrial tachycardia, lasting for less than 4 seconds.    Labs/Studies     Relevant CV Studies:  Echocardiogram: 12/01/2018 IMPRESSIONS   1. The left ventricle has normal systolic function with an ejection fraction of 60-65%. The cavity size was normal. Left ventricular diastolic Doppler parameters are consistent with impaired relaxation. No evidence of left ventricular regional wall  motion abnormalities.  2. The right ventricle has normal systolic function. The cavity was normal. There is no increase in right ventricular wall thickness.  3. The aortic valve is tricuspid. Aortic valve regurgitation is mild by color flow Doppler. Mild aortic annular calcification noted.  4. The mitral valve is grossly normal.  5. The tricuspid valve is grossly normal. There is mild tricuspid regurgitation.  6. The aortic root is normal in size and structure.   Laboratory Data:  Chemistry Recent Labs  Lab 11/30/18 1625 12/01/18 0402 12/02/18 0233  NA 139 137 136  K 3.7 3.8 3.4*  CL 102 105 98  CO2 '23 25 28  '$ GLUCOSE 127* 95 103*  BUN 8 7* 8  CREATININE 0.72 0.58 0.72  CALCIUM 8.9 8.1* 8.6*  GFRNONAA >60 >60 >60    GFRAA >60 >60 >60  ANIONGAP '14 7 10    '$ Recent  Labs  Lab 11/30/18 1625 12/01/18 0402 12/02/18 0233  PROT 6.5 5.3* 6.2*   6.2*  ALBUMIN 2.7* 2.2* 2.6*   2.6*  AST 136* 67* 37   37  ALT 116* 75* 61*   61*  ALKPHOS 425* 299* 279*   272*  BILITOT 0.8 0.8 0.9   1.1   Hematology Recent Labs  Lab 11/30/18 1625 12/01/18 0402 12/02/18 0233  WBC 8.8 9.5 10.6*  RBC 4.57 3.70* 4.26   4.26  HGB 12.4 10.1* 11.6*  HCT 38.9 30.9* 36.0  MCV 85.1 83.5 84.5  MCH 27.1 27.3 27.2  MCHC 31.9 32.7 32.2  RDW 14.6 14.4 14.2  PLT 389 347 418*   Cardiac Enzymes Recent Labs  Lab 11/30/18 1625 11/30/18 2033 12/01/18 0402 12/01/18 0801  TROPONINI 0.05* 0.05* 0.04* 0.04*   No results for input(s): TROPIPOC in the last 168 hours.  BNP Recent Labs  Lab 11/30/18 2033  BNP 228.0*    DDimer  Recent Labs  Lab 11/30/18 1625  DDIMER 4.79*    Radiology/Studies:  Dg Chest 2 View  Result Date: 11/30/2018 CLINICAL DATA:  Shortness of breath and hypoxia. EXAM: CHEST - 2 VIEW COMPARISON:  None. FINDINGS: Lungs are adequately inflated demonstrate small bilateral pleural effusions likely with associated bibasilar atelectasis. Minimal prominence of the perihilar vessels suggesting mild degree of vascular congestion. Cardiomediastinal silhouette is unremarkable. There are degenerative changes of the spine. IMPRESSION: Suggestion of mild vascular congestion. Small bilateral pleural effusions with associated bibasilar atelectasis. Infection in the lung bases is possible. Electronically Signed   By: Marin Olp M.D.   On: 11/30/2018 17:02   Ct Angio Chest Pe W And/or Wo Contrast  Result Date: 11/30/2018 CLINICAL DATA:  Hypoxia, recent cholecystectomy EXAM: CT ANGIOGRAPHY CHEST WITH CONTRAST TECHNIQUE: Multidetector CT imaging of the chest was performed using the standard protocol during bolus administration of intravenous contrast. Multiplanar CT image reconstructions and MIPs were obtained to evaluate the  vascular anatomy. CONTRAST:  <See Chart> OMNIPAQUE IOHEXOL 350 MG/ML SOLN COMPARISON:  Chest radiograph, 11/30/2018 FINDINGS: Cardiovascular: Satisfactory opacification of the pulmonary arteries to the segmental level. No evidence of pulmonary embolism. Aortic atherosclerosis. Normal heart size. Three-vessel coronary artery calcifications. No pericardial effusion. Mediastinum/Nodes: No enlarged mediastinal, hilar, or axillary lymph nodes. Thyroid gland, trachea, and esophagus demonstrate no significant findings. There is a fat containing left-sided Bochdalek's type hernia. Lungs/Pleura: Small bilateral pleural effusions, right greater than left, with associated atelectasis or consolidation. Underlying moderate centrilobular emphysema. Upper Abdomen: No acute abnormality. Musculoskeletal: Scattered subcutaneous emphysema about the right chest wall, likely related to recent laparoscopic surgery. No acute or significant osseous findings. Review of the MIP images confirms the above findings. IMPRESSION: 1.  Negative examination for pulmonary embolism. 2. Small, right greater than left pleural effusions with associated atelectasis or consolidation. 3.  Emphysema. 4.  Coronary artery disease. 5. Scattered subcutaneous emphysema about the right chest wall, likely related to recent laparoscopic surgery. Electronically Signed   By: Eddie Candle M.D.   On: 11/30/2018 19:05   Dg Chest Port 1 View  Result Date: 12/02/2018 CLINICAL DATA:  Shortness of breath EXAM: PORTABLE CHEST 1 VIEW COMPARISON:  11/30/2018 FINDINGS: Cardiac shadow is stable. Aortic calcifications are noted. Bilateral pleural effusions are noted right greater than left. No definitive focal infiltrate is seen. No bony abnormality is noted. IMPRESSION: Bilateral pleural effusions right greater than left. Electronically Signed   By: Inez Catalina M.D.   On: 12/02/2018 08:39     Assessment &  Plan    1. Acute Diastolic CHF Exacerbation - presented with  worsening dyspnea on exertion and edema with BNP elevated to 228 on admission and CXR showing bilateral pleural effusions. Likely secondary to administration of IVF during her recent admission. D-dimer elevated but CTA negative for PE.  - received IV Lasix '40mg'$  x2 yesterday yet I&O's have not been recorded. However, weight has declined by 149 lbs on admission to 146 lbs today. She now feels back to baseline and volume status has improved by examination. Repeat CXR this AM does show persistent pleural effusions. She is currently NPO given ERCP this AM. Will write for additional IV Lasix this afternoon and reassess volume status in AM.  Potassium supplementation already ordered by the admitting team.   2. Coronary Calcifications/ Elevated Troponin - noted to have coronary calcifications on CTA this admission. Echo shows a preserved EF of 60-65% with no regional WMA. Initial and cyclic troponin values flat at 0.05, 0.05, and 0.04. - enzyme elevation likely secondary to mild CHF exacerbation. No plans for further inpatient cardiac evaluation. Would consider outpatient stress testing in the future once recovered from her recent surgery and procedures.     3. Aortic Regurgitation - mild by echocardiogram this admission. Continue to follow as an outpatient.   4. HLD - followed by PCP. On Atorvastatin '40mg'$  daily and Fish Oil as an outpatient.   5. Choledocholithiasis - s/p laparoscopic cholecystectomy on 11/27/2018 and undergoing ERCP later today with biliary sphincterotomy and stone extraction by review of GI notes.  - being followed by GI and General Surgery.    For questions or updates, please contact [place] Please consult [email or web address] for contact info under Cardiology/STEMI.  Signed, Erma Heritage, PA-C 12/02/2018, 8:45 AM Pager: [phone]   Attending note:  Patient seen and examined.  Reviewed records and discussed the case with Sherry Bridges, I agree with her above  documentation and recommendations.  Sherry Bridges is preparing for ERCP this morning with follow-up by Gastroenterology and General Surgery for choledocholithiasis.  Her shortness of breath has improved after IV Lasix, recent episode of fluid overload and likely acute diastolic heart failure noted.  Echocardiogram demonstrates normal LVEF at 60 to 65% and no regional wall motion abnormalities.  Minor troponin I elevations of 0.04-0.05 are not suggestive of ACS.  She appears comfortable on examination this morning.  She is in sinus rhythm by telemetry with heart rate in the 80s, afebrile, systolic blood pressure in the 130s.  Lungs are clear at this time without wheezing or crackles.  Cardiac exam reveals RRR without gallop.  0.4, BUN 8, creatinine 0.72, AST 37, ALT 61 nine 0.05, BNP 228, hemoglobin 11.6, platelets 418.  Follow-up chest x-ray shows bilateral pleural effusions right greater than left.  I personally reviewed her ECG from 12/01/2018 which shows sinus rhythm with left atrial enlargement, decreased anteroseptal R wave progression.  Likely acute diastolic heart failure in the setting of fluid resuscitation, now improving with IV diuresis.  LVEF is normal at 60 to 65%.  ACS is not suspected.  Proceed with planned GI work-up and we will give additional IV Lasix.  She is also noted to have coronary artery calcifications by CT imaging, this can be investigated later as an outpatient.  Satira Sark, M.D., F.A.C.C.

## 2018-12-02 NOTE — Progress Notes (Signed)
Brief ERCP note  Mild ampullary edema. Pancreatic stent had spontaneously passed. CBD cannulated without difficulty. CBD and CHD dilated. CBD>CHD with 3 stones in the region of hilum. Very sphincterotomy performed.  Sphincterotomy dilated to 8 mm. The wounds could not be removed with Dormia basket or balloon. Therefore [place] 7 cm plastic stent left in place.   She will return for outpatient ERCP and 8 weeks or so.

## 2018-12-02 NOTE — Progress Notes (Signed)
[facility]  No complaints this AM. Feeling ok. LFTs going down. Tolerated diet. Plan for ERCP today. Appreciate Dr. Olevia Perches assistance.  Curlene Labrum, MD [facility] [address] [place], [place] [address] [phone] (office)

## 2018-12-02 NOTE — Anesthesia Postprocedure Evaluation (Signed)
Anesthesia Post Note  Patient: Sherry Bridges  Procedure(s) Performed: ENDOSCOPIC RETROGRADE CHOLANGIOPANCREATOGRAPHY (ERCP) (N/A ) [place]  Patient location during evaluation: PACU Anesthesia Type: General Level of consciousness: awake and alert and patient cooperative Pain management: pain level controlled Vital Signs Assessment: post-procedure vital signs reviewed and stable Respiratory status: spontaneous breathing, nonlabored ventilation and respiratory function stable Cardiovascular status: blood pressure returned to baseline Postop Assessment: no apparent nausea or vomiting Anesthetic complications: no     Last Vitals:  Vitals:   12/02/18 1200 12/02/18 1437  BP: (!) 146/66 131/66  Pulse: 65 99  Resp: 16 (!) 27  Temp:  36.4 C  SpO2:  95%    Last Pain:  Vitals:   12/02/18 1126  TempSrc: Oral  PainSc: 0-No pain                 Sherry Bridges

## 2018-12-02 NOTE — Progress Notes (Addendum)
PROGRESS NOTE    Sherry Bridges  ZOX:[id number] DOB: [birth year] DOA: 11/30/2018 PCP: Toma Deiters, MD  Brief Narrative:  HPI per Dr. Pearson Bridges on 11/30/2018 Sherry Bridges  is a 78 y.o. female, hypertension, hyperlipidemia, hypothyroidism, former smoker, Copd (not on home o2),  admitted at [facility] for acute cholecystitis  , and transferred to [facility] for ERCP.  Pt taken for ERCP on 11/26/2018 unsuccesfull as common bile duct could not be cannulated due to small ampullary orifice, plastic stent placed in main pancreatic duct for prophylactic purposes. Lap chole by Algis Greenhouse on 11/27/2018.  Pt was discharged on 6/4 Thursday and presented today due to c/o dyspnea today as well as low pox. Pt denies fever, chills, cough, cp, palp, n/v, abd pain, diarrhea, brbpr, dysuria. Pt was formerly taking lorazepam but not recently and denies any oxycodone use today.     In ED,  T 98  P 73  R 22, Bp 143/65  Pox 93% on RA  CTA chest IMPRESSION: 1. Negative examination for pulmonary embolism. 2. Small, right greater than left pleural effusions with associated atelectasis or consolidation. 3. Emphysema. 4. Coronary artery disease. 5. Scattered subcutaneous emphysema about the right chest wall, likely related to recent laparoscopic surgery.  Wbc 8.8, Hgb 12.4, Plt 389   Na 139, K 3.7  Bun 8 , creatinine 0.72   Glucose 127 Trop 0.05  EKG nsr at 75, nl axis, nl pr, nl qt int, no st-t changes c/w ischemia.   Pt will be admitted for c/o dyspnea, low pox, as well as Copd , bilateral pleural effusions R>L and possibly Hcap   **Interim History Was diuresed and underwent echocardiogram which showed diastolic dysfunction.  She is improved respiratory wise and was no longer requiring supplemental oxygen via nasal cannula and O2 saturations were in the 90s.  We will repeat diuresis tonight and patient was started on medications for COPD.  Will need outpatient PFTs.  Assessment & Plan:   Active  Problems:   Hypothyroidism   Essential hypertension   Hyperlipidemia   Dyspnea   Abnormal liver function   Elevated troponin   Acute respiratory failure with hypoxia (HCC)  Acute Respiratory Failure with Hypoxia secondary to acute Diastolic CHF exacerbation with questionable COPD component, improving  -Patient is currently afebrile and has no leukocytosis -Was having desaturations with home pulse oximetry with oxygen dropping to the low 80s along with dyspnea on exertion -Chest x-ray showed suggestion of mild vascular congestion and there are small bilateral pleural effusions with associated bibasilar atelectasis.  Infection in the lung bases was possible however do not feel that this is truly an infection likely this is atelectasis -Because of elevated d-dimer 4.79 a CTA of the chest was done and showed "Negative examination for pulmonary embolism. Small, right greater than left pleural effusions with associated atelectasis or consolidation. Emphysema. Coronary artery disease. Scattered subcutaneous emphysema about the right chest wall, likely related to recent laparoscopic surgery." -BNP on admission was 228  -SARS-CoV-2 testing was negative -Echocardiogram done and showed an EF of 60 to 65% but did diastolic dysfunction which is new for the patient -Diuresed with IV Lasix 40 mg twice yesterday and will get another IV dose of Lasix this Afternoon per Cardiology  -Patient is currently off of oxygen now but per my Discussion with Gastroenterology her Sats dropped again spontaneously in Pre-op  -Continuous pulse oximetry and maintain O2 saturations greater than 90% -Continue with supplemental oxygen via nasal cannula and wean O2  as tolerated currently is not on any -We will add incentive spirometry, flutter valve and guaifenesin 1200 mg p.o. twice daily -Check Ambulatory Home O2 screen and did not desaturate this AM but will repeat again tomorrow  -Repeat chest this AM showed "Cardiac shadow  is stable. Aortic calcifications are noted. Bilateral pleural effusions are noted right greater than left. No definitive focal infiltrate is seen. No bony abnormality is noted." -Repeat CXR in AM   Acute Diastolic CHF Exacerbation -Echo showed EF of 60 to 65% with diastolic dysfunction -BNP on admission was 228 -Troponins were cycled and they were minimally elevated but flat and not really indicative of ACS -Patient not complaining of any chest pain and shortness of breath is improved with diuresis -Patient was dyspneic on exertion and not dyspenic this AM  -We will continue with strict I's and O's, daily weights and fluid restriction -We will need to control blood pressure adequately -CTA of the chest did show some coronary artery disease and she will benefit from an outpatient cardiology evaluation and will notify Cardiology to set up via Epic Consult; -Cardiology evaluated and recommending additional IV Lasix this Afternoon and re-assessing Volume Status in AM  -They will consider outpatient Stress Testing in the future one she is recovered from these procedures. -Continue to Monitor Volume Status Carefully and avoid Volume Overload -Will need Ambulatory Home O2 Screen Prior to D/C  Bilateral Pleural Effusions  -Right >Left (seems too small to tap) and seen on CTA  -Checked Transthoracic ECHO as below -Antibiotics Never Started -Given 2 Doses of IV Lasix and cardiology will be dosing again later today with another IV 40 mg -Repeat CXR this a.m.  COPD with Emphysema -Started on Umeclidinium-Vilanterol 62.5-25 mcg/IH 1 puff Inhalation Daily  -Started Albuterol Neb 2.5 mg q6h prn  -Will need to Arrange PFTs as outpatient  Elevated Troponin -In the setting of Demand Ischemia from CHF Exacerbation and Hypoxia  -Trended Troponin and they were 0.05 -> 0.05 -> 0.04 -> 0.04 and Flat and not indicative of ACS -C/w Telemetry Monitoring  -Checked Cardiac ECHOCardiogram as below; showed "The  left ventricle has normal systolic function, with an ejection fraction of 60-65%. The cavity size was normal. There is no increase in left ventricular wall thickness. Left ventricular diastolic Doppler parameters are consistent with impaired relaxation. No evidence of left ventricular regional wall motion abnormalities." -Denies any CP  -EKG showed a Sinus Rhythm at 74 and was non-ischemic   H/o Acute cholecystitis w Lap Chole 11/27/2018, ERCP w pancreatic duct plastic stent 11/26/2018 -Had Unsuccessful ERCP on 11/26/2018 and then had Cholecystectomy on 11/27/2018 -Surgery Consulted and recommends no Surgical Interventions at this time -Plan was for repeat ERCP with Biliary Sphincterotomy and Stone Extraction as well as removal of Pancreatic Plastic Stent this AM; -She underwent ERCP and she had mild ampullary edema and is noted that her pancreatic stent had spontaneously passed.  Her common bile duct was cannulated without difficulty and it was seen that her common bile duct and common hepatic duct were dilated with the common bile duct being greater with 3 stones in the region of the hilum.  A sphincterotomy was performed and sphincter mildly dilated to 8 mm.  Per report the stones could not be removed with basket or balloon so a 7 cm [place] plastic stent was left in place and patient is to return for outpatient ERCP in 8 weeks or so -I discussed the case with gastroenterology and will observe overnight and advance  diet as tolerated  Abnormal LFTs -Trending down as AST went from 136 -> 67 -> 37 and ALT went from 116 -> 75 -> 61 -Acute Hepatitis Panel Negative -Stopped Atorvastatin due to Abnormal LFTs -Continue to Monitor and Trend and Repeat CMP in AM   Hypothyroidism -TSH last Admission was 1.674 -Contine Levothyroxine 88 mcg po Daily   H/o Hypertension -Continue to Monitor BP Closely; Last value was 141/66 -Started Hydralazine 5 mg IV q6h prn SBP >160 or DBP >100  Hyperlipidemia -Because  of Abnormal LFTs, Atorvastatin has been currently stopped but LAFTs are trending down now and likely can be resumed in the next couple of days   Tobacco Abuse -Smoking Cessation Counseling given -C/w Nicotine 21 mg TD q24h  Pre-Diabetes -Patient's HbA1c was 5.9 -Will need to Monitor Blood Sugars Carefully; Blood Sugar ranging from 95-103 on BMP/CMP -If Consistently elevated will need to place on Sensitive Novolog SSI AC  Normocytic Anemia -Patient's Hb/Hct went from 12.4/38.9 on admission -> 10.1/30.9 -> 11.6/36.0 -? Hemoconcentration on Admission -Checked Anemia Panel and showed a level of 27, U IBC of 286, TIBC of 313, saturation ratios 9%, ferritin level 96, folate level 15.3, vitamin B12 of 55 -We will start vitamin B12 supplementation and will give 1000 mcg IM injection and then start 1000 mcg p.o. daily -Patient will also benefit from iron supplementation and will start iron polysaccharide 150 mg daily -Continue to Monitor for S/Sx of Bleeding; Currently no Overt Bleeding Noted -Repeat CBC In AM   Hypokalemia -Patient's K+ this AM was 3.4 -Replete with IV KCl 40 mEQ as she was NPO -Continue to Monitor and Replete as Necessary -Repeat CMP in AM   DVT prophylaxis: Enoxaparin 40 mg sq q24h Code Status: FULL CODE Family Communication: No family present at bedside Disposition Plan: Remain Inpatient for Continued workup and ERCP done this AM; Anticipate D/C Home in the next 24-48 hours if stable and not hypoxic   Consultants:   Gastroenterology  General Surgery  Cardiology    Procedures:  ECHOCARDIOGRAM IMPRESSIONS    1. The left ventricle has normal systolic function with an ejection fraction of 60-65%. The cavity size was normal. Left ventricular diastolic Doppler parameters are consistent with impaired relaxation. No evidence of left ventricular regional wall  motion abnormalities.  2. The right ventricle has normal systolic function. The cavity was normal. There  is no increase in right ventricular wall thickness.  3. The aortic valve is tricuspid. Aortic valve regurgitation is mild by color flow Doppler. Mild aortic annular calcification noted.  4. The mitral valve is grossly normal.  5. The tricuspid valve is grossly normal. There is mild tricuspid regurgitation.  6. The aortic root is normal in size and structure.  FINDINGS  Left Ventricle: The left ventricle has normal systolic function, with an ejection fraction of 60-65%. The cavity size was normal. There is no increase in left ventricular wall thickness. Left ventricular diastolic Doppler parameters are consistent with  impaired relaxation. No evidence of left ventricular regional wall motion abnormalities..  Right Ventricle: The right ventricle has normal systolic function. The cavity was normal. There is no increase in right ventricular wall thickness.  Left Atrium: Left atrial size was normal in size.  Right Atrium: Right atrial size was normal in size. Right atrial pressure is estimated at 3 mmHg.  Interatrial Septum: No atrial level shunt detected by color flow Doppler.  Pericardium: There is no evidence of pericardial effusion.  Mitral Valve: The mitral valve is  grossly normal. Mitral valve regurgitation is trivial by color flow Doppler.  Tricuspid Valve: The tricuspid valve is grossly normal. Tricuspid valve regurgitation is mild by color flow Doppler.  Aortic Valve: The aortic valve is tricuspid Aortic valve regurgitation is mild by color flow Doppler. Mild aortic annular calcification noted.  Pulmonic Valve: The pulmonic valve was grossly normal. Pulmonic valve regurgitation is trivial by color flow Doppler.  Aorta: The aortic root is normal in size and structure.    +--------------+--------++ LEFT VENTRICLE              +----------------+---------++ +--------------+--------++      Diastology                PLAX 2D                      +----------------+---------++ +--------------+--------++      LV e' lateral:  7.94 cm/s LVIDd:        4.11 cm       +----------------+---------++ +--------------+--------++      LV E/e' lateral:9.4       LVIDs:        2.58 cm       +----------------+---------++ +--------------+--------++      LV e' medial:   6.53 cm/s LV PW:        0.81 cm       +----------------+---------++ +--------------+--------++      LV E/e' medial: 11.5      LV IVS:       0.80 cm       +----------------+---------++ +--------------+--------++ LVOT diam:    1.70 cm  +--------------+--------++ LV SV:        51 ml    +--------------+--------++ LV SV Index:  27.80    +--------------+--------++ LVOT Area:    2.27 cm +--------------+--------++                        +--------------+--------++   +------------------+---------++ LV Volumes (MOD)            +------------------+---------++ LV area d, A2C:   18.10 cm +------------------+---------++ LV area d, A4C:   17.40 cm +------------------+---------++ LV area s, A2C:   8.56 cm  +------------------+---------++ LV area s, A4C:   9.48 cm  +------------------+---------++ LV major d, A2C:  6.82 cm   +------------------+---------++ LV major d, A4C:  6.39 cm   +------------------+---------++ LV major s, A2C:  4.45 cm   +------------------+---------++ LV major s, A4C:  4.73 cm   +------------------+---------++ LV vol d, MOD A2C:40.2 ml   +------------------+---------++ LV vol d, MOD A4C:38.8 ml   +------------------+---------++ LV vol s, MOD A2C:15.2 ml   +------------------+---------++ LV vol s, MOD A4C:16.0 ml   +------------------+---------++ LV SV MOD A2C:    25.0 ml   +------------------+---------++ LV SV MOD A4C:    38.8 ml   +------------------+---------++ LV SV MOD BP:     24.8 ml   +------------------+---------++   +---------------+----------++ RIGHT VENTRICLE           +---------------+----------++ RV S prime:    17.90 cm/s +---------------+----------++ TAPSE (M-mode):2.4 cm     +---------------+----------++  +---------------+-------++-----------++ LEFT ATRIUM           Index       +---------------+-------++-----------++ LA diam:       3.20 cm1.81 cm/m  +---------------+-------++-----------++ LA Vol (A2C):  30.4 ml17.17 ml/m +---------------+-------++-----------++ LA Vol (A4C):  43.6 ml24.63 ml/m +---------------+-------++-----------++ LA Biplane Vol:36.9 ml20.85 ml/m +---------------+-------++-----------++ +------------+---------++-----------++ RIGHT ATRIUM  Index       +------------+---------++-----------++ RA Area:    17.60 cm            +------------+---------++-----------++ RA Volume:  43.30 ml 24.46 ml/m +------------+---------++-----------++  +------------------+------------++ AORTIC VALVE                   +------------------+------------++ AV Area (Vmax):   1.60 cm     +------------------+------------++ AV Area (Vmean):  1.64 cm     +------------------+------------++ AV Area (VTI):    1.71 cm     +------------------+------------++ AV Vmax:          183.00 cm/s  +------------------+------------++ AV Vmean:         125.000 cm/s +------------------+------------++ AV VTI:           0.403 m      +------------------+------------++ AV Peak Grad:     13.4 mmHg    +------------------+------------++ AV Mean Grad:     7.0 mmHg     +------------------+------------++ LVOT Vmax:        129.00 cm/s  +------------------+------------++ LVOT Vmean:       90.200 cm/s  +------------------+------------++ LVOT VTI:         0.304 m      +------------------+------------++ LVOT/AV VTI ratio:0.75         +------------------+------------++ AR PHT:           479  msec     +------------------+------------++   +-------------+-------++ AORTA                +-------------+-------++ Ao Root diam:3.20 cm +-------------+-------++  +--------------+----------++  +---------------+-----------++ MITRAL VALVE              TRICUSPID VALVE            +--------------+----------++  +---------------+-----------++ MV Area (PHT):3.21 cm    TR Peak grad:  34.1 mmHg   +--------------+----------++  +---------------+-----------++ MV PHT:       68.44 msec  TR Vmax:       292.00 cm/s +--------------+----------++  +---------------+-----------++ MV Decel Time:236 msec   +--------------+----------++  +--------------+-------+ +--------------+-----------++ SHUNTS                MV E velocity:74.80 cm/s  +--------------+-------+ +--------------+-----------++ Systemic VTI: 0.30 m  MV A velocity:112.00 cm/s +--------------+-------+ +--------------+-----------++ Systemic Diam:1.70 cm MV E/A ratio: 0.67        +--------------+-------+ +--------------+-----------++  Antimicrobials:  Anti-infectives (From admission, onward)   Start     Dose/Rate Route Frequency Ordered Stop   12/02/18 1315  ceFAZolin (ANCEF) IVPB 2g/100 mL premix     2 g 200 mL/hr over 30 Minutes Intravenous  Once 12/02/18 1305 12/02/18 1340     Subjective: Seen and examined at bedside prior to her ERCP and she states that she wanted to get it over with.  States that she has been urinating quite frequently.  No nausea or vomiting.  Does not think short of breath.  No other concerns or complaints at this time and feels okay but still complains of some mild abdominal pain and soreness.  Objective: Vitals:   12/02/18 1445 12/02/18 1500 12/02/18 1515 12/02/18 1530  BP: 137/65 (!) 141/57 (!) 140/57 119/67  Pulse: 89 78 72 69  Resp: (!) 23 (!) 21 18 20   Temp:    98.1 F (36.7 C)  TempSrc:      SpO2: 93% 97% 93% 95%  Weight:      Height:         Intake/Output Summary (Last 24 hours) at  12/02/2018 1556 Last data filed at 12/02/2018 1433 Gross per 24 hour  Intake 980 ml  Output 0 ml  Net 980 ml   Filed Weights   11/30/18 1601 11/30/18 2116 12/02/18 0514  Weight: 67.6 kg 71.7 kg 66.4 kg   Examination: Physical Exam:  Constitutional: Well-nourished, well-developed overweight Caucasian female currently no acute distress appears calm and comfortable waiting for her ERCP to be done Eyes: Lids and conjunctive are normal.  Sclera anicteric ENMT: External ears and nose appear normal.  Grossly normal hearing.  Mucous members are moist Neck: Appears supple no JVD Respiratory: Diminished auscultation bilaterally at the bases with some slight crackles but no appreciable wheezing, rales, rhonchi.  Patient not tachypneic wheezing any accessory muscle breathe Cardiovascular: Regular rate and rhythm.  No appreciable murmurs, rubs, or gallops.  Has slight lower extremity edema Abdomen: Soft, nontender, slightly distended.  Bowel sounds present all 4 quadrants. GU: Deferred Musculoskeletal: No contractures or cyanosis.  No joint deformities in the upper lower extremities Skin: Skin is warm and dry no appreciable rashes or lesions limited skin evaluation. Neurologic: Cranial nerves II through XII grossly intact no visual focal deficits.  Romberg sign and cerebellar reflexes were not assessed Psychiatric: Normal judgment and insight.  Patient is awake, alert, oriented x3.  Has a pleasant mood and affect  Data Reviewed: I have personally reviewed following labs and imaging studies  CBC: Recent Labs  Lab 11/26/18 0437 11/27/18 0409 11/30/18 1625 12/01/18 0402 12/02/18 0233  WBC 19.8* 13.4* 8.8 9.5 10.6*  NEUTROABS 15.7* 10.8* 4.9  --  5.6  HGB 12.6 11.3* 12.4 10.1* 11.6*  HCT 40.6 37.3 38.9 30.9* 36.0  MCV 87.7 89.2 85.1 83.5 84.5  PLT 331 305 389 347 418*   Basic Metabolic Panel: Recent Labs  Lab 11/26/18 0437 11/27/18 0409 11/30/18  1625 12/01/18 0402 12/02/18 0233  NA 136 138 139 137 136  K 4.2 4.4 3.7 3.8 3.4*  CL 105 108 102 105 98  CO2 GLUCOSE 92 86 127* 95 103*  BUN 7* 8  CREATININE 0.75 0.90 0.72 0.58 0.72  CALCIUM 8.3* 7.9* 8.9 8.1* 8.6*  MG  --  1.9  --   --  1.9  PHOS  --  1.6*  --   --  3.9   GFR: Estimated Creatinine Clearance: 55.2 mL/min (by C-G formula based on SCr of 0.72 mg/dL). Liver Function Tests: Recent Labs  Lab 11/26/18 0437 11/27/18 0409 11/30/18 1625 12/01/18 0402 12/02/18 0233  AST 17 47* 136* 67* 37  37  ALT 13 26 116* 75* 61*  61*  ALKPHOS 46 150* 425* 299* 279*  272*  BILITOT 0.7 1.2 0.8 0.8 0.9  1.1  PROT 5.7* 5.1* 6.5 5.3* 6.2*  6.2*  ALBUMIN 2.7* 2.3* 2.7* 2.2* 2.6*  2.6*   Recent Labs  Lab 11/26/18 0437 11/27/18 0409 12/01/18 0806  LIPASE 21  --   --   AMYLASE  --  138* 63   No results for input(s): AMMONIA in the last 168 hours. Coagulation Profile: Recent Labs  Lab 11/26/18 0437  INR 1.3*   Cardiac Enzymes: Recent Labs  Lab 11/30/18 1625 11/30/18 2033 12/01/18 0402 12/01/18 0801  CKTOTAL  --  156  --   --   CKMB  --  1.9  --   --   TROPONINI 0.05* 0.05* 0.04* 0.04*   BNP (last 3 results) No results for input(s): PROBNP in the last  8760 hours. HbA1C: Recent Labs    12/01/18 0402  HGBA1C 5.9*   CBG: No results for input(s): GLUCAP in the last 168 hours. Lipid Profile: No results for input(s): CHOL, HDL, LDLCALC, TRIG, CHOLHDL, LDLDIRECT in the last 72 hours. Thyroid Function Tests: No results for input(s): TSH, T4TOTAL, FREET4, T3FREE, THYROIDAB in the last 72 hours. Anemia Panel: Recent Labs    12/02/18 0233  VITAMINB12 55*  FOLATE 15.3  FERRITIN 96  TIBC 313  IRON 27*  RETICCTPCT 2.7   Sepsis Labs: No results for input(s): PROCALCITON, LATICACIDVEN in the last 168 hours.  Recent Results (from the past 240 hour(s))  SARS Coronavirus 2 (CEPHEID - Performed in [facility] hospital lab), Hosp Order      Status: None   Collection Time: 11/25/18  7:26 PM  Result Value Ref Range Status   SARS Coronavirus 2 NEGATIVE NEGATIVE Final    Comment: (NOTE) If result is NEGATIVE SARS-CoV-2 target nucleic acids are NOT DETECTED. The SARS-CoV-2 RNA is generally detectable in upper and lower  respiratory specimens during the acute phase of infection. The lowest  concentration of SARS-CoV-2 viral copies this assay can detect is 250  copies / mL. A negative result does not preclude SARS-CoV-2 infection  and should not be used as the sole basis for treatment or other  patient management decisions.  A negative result may occur with  improper specimen collection / handling, submission of specimen other  than nasopharyngeal swab, presence of viral mutation(s) within the  areas targeted by this assay, and inadequate number of viral copies  (<250 copies / mL). A negative result must be combined with clinical  observations, patient history, and epidemiological information. If result is POSITIVE SARS-CoV-2 target nucleic acids are DETECTED. The SARS-CoV-2 RNA is generally detectable in upper and lower  respiratory specimens dur ing the acute phase of infection.  Positive  results are indicative of active infection with SARS-CoV-2.  Clinical  correlation with patient history and other diagnostic information is  necessary to determine patient infection status.  Positive results do  not rule out bacterial infection or co-infection with other viruses. If result is PRESUMPTIVE POSTIVE SARS-CoV-2 nucleic acids MAY BE PRESENT.   A presumptive positive result was obtained on the submitted specimen  and confirmed on repeat testing.  While 2019 novel coronavirus  (SARS-CoV-2) nucleic acids may be present in the submitted sample  additional confirmatory testing may be necessary for epidemiological  and / or clinical management purposes  to differentiate between  SARS-CoV-2 and other Sarbecovirus currently known to  infect humans.  If clinically indicated additional testing with an alternate test  methodology [phone]) is advised. The SARS-CoV-2 RNA is generally  detectable in upper and lower respiratory sp ecimens during the acute  phase of infection. The expected result is Negative. Fact Sheet for Patients:  BoilerBrush.com.cy Fact Sheet for Healthcare Providers: https://pope.com/ This test is not yet approved or cleared by the [place] FDA and has been authorized for detection and/or diagnosis of SARS-CoV-2 by FDA under an Emergency Use Authorization (EUA).  This EUA will remain in effect (meaning this test can be used) for the duration of the COVID-19 declaration under Section 564(b)(1) of the Act, 21 U.S.C. section 360bbb-3(b)(1), unless the authorization is terminated or revoked sooner. Performed at [facility], [address], [place], [place] [address]   Surgical PCR screen     Status: None   Collection Time: 11/27/18 12:50 AM  Result Value Ref Range Status  MRSA, PCR NEGATIVE NEGATIVE Final   Staphylococcus aureus NEGATIVE NEGATIVE Final    Comment: (NOTE) The Xpert SA Assay (FDA approved for NASAL specimens in patients 35 years of age and older), is one component of a comprehensive surveillance program. It is not intended to diagnose infection nor to guide or monitor treatment. Performed at [facility], [address] [address], [place], [place] [address]   SARS Coronavirus 2 (CEPHEID- Performed in [facility] hospital lab), Hosp Order     Status: None   Collection Time: 11/30/18  6:24 PM  Result Value Ref Range Status   SARS Coronavirus 2 NEGATIVE NEGATIVE Final    Comment: (NOTE) If result is NEGATIVE SARS-CoV-2 target nucleic acids are NOT DETECTED. The SARS-CoV-2 RNA is generally detectable in upper and lower  respiratory specimens during the acute phase of infection. The lowest  concentration of SARS-CoV-2 viral copies this assay  can detect is 250  copies / mL. A negative result does not preclude SARS-CoV-2 infection  and should not be used as the sole basis for treatment or other  patient management decisions.  A negative result may occur with  improper specimen collection / handling, submission of specimen other  than nasopharyngeal swab, presence of viral mutation(s) within the  areas targeted by this assay, and inadequate number of viral copies  (<250 copies / mL). A negative result must be combined with clinical  observations, patient history, and epidemiological information. If result is POSITIVE SARS-CoV-2 target nucleic acids are DETECTED. The SARS-CoV-2 RNA is generally detectable in upper and lower  respiratory specimens dur ing the acute phase of infection.  Positive  results are indicative of active infection with SARS-CoV-2.  Clinical  correlation with patient history and other diagnostic information is  necessary to determine patient infection status.  Positive results do  not rule out bacterial infection or co-infection with other viruses. If result is PRESUMPTIVE POSTIVE SARS-CoV-2 nucleic acids MAY BE PRESENT.   A presumptive positive result was obtained on the submitted specimen  and confirmed on repeat testing.  While 2019 novel coronavirus  (SARS-CoV-2) nucleic acids may be present in the submitted sample  additional confirmatory testing may be necessary for epidemiological  and / or clinical management purposes  to differentiate between  SARS-CoV-2 and other Sarbecovirus currently known to infect humans.  If clinically indicated additional testing with an alternate test  methodology [phone]) is advised. The SARS-CoV-2 RNA is generally  detectable in upper and lower respiratory sp ecimens during the acute  phase of infection. The expected result is Negative. Fact Sheet for Patients:  BoilerBrush.com.cy Fact Sheet for Healthcare Providers:  https://pope.com/ This test is not yet approved or cleared by the [place] FDA and has been authorized for detection and/or diagnosis of SARS-CoV-2 by FDA under an Emergency Use Authorization (EUA).  This EUA will remain in effect (meaning this test can be used) for the duration of the COVID-19 declaration under Section 564(b)(1) of the Act, 21 U.S.C. section 360bbb-3(b)(1), unless the authorization is terminated or revoked sooner. Performed at [facility], [address] [place], [place] [address]     Radiology Studies: Dg Chest 2 View  Result Date: 11/30/2018 CLINICAL DATA:  Shortness of breath and hypoxia. EXAM: CHEST - 2 VIEW COMPARISON:  None. FINDINGS: Lungs are adequately inflated demonstrate small bilateral pleural effusions likely with associated bibasilar atelectasis. Minimal prominence of the perihilar vessels suggesting mild degree of vascular congestion. Cardiomediastinal silhouette is unremarkable. There are degenerative changes of the spine. IMPRESSION:  Suggestion of mild vascular congestion. Small bilateral pleural effusions with associated bibasilar atelectasis. Infection in the lung bases is possible. Electronically Signed   By: Elberta Fortisaniel  Boyle M.D.   On: 11/30/2018 17:02   Ct Angio Chest Pe W And/or Wo Contrast  Result Date: 11/30/2018 CLINICAL DATA:  Hypoxia, recent cholecystectomy EXAM: CT ANGIOGRAPHY CHEST WITH CONTRAST TECHNIQUE: Multidetector CT imaging of the chest was performed using the standard protocol during bolus administration of intravenous contrast. Multiplanar CT image reconstructions and MIPs were obtained to evaluate the vascular anatomy. CONTRAST:  <See Chart> OMNIPAQUE IOHEXOL 350 MG/ML SOLN COMPARISON:  Chest radiograph, 11/30/2018 FINDINGS: Cardiovascular: Satisfactory opacification of the pulmonary arteries to the segmental level. No evidence of pulmonary embolism. Aortic atherosclerosis. Normal heart size. Three-vessel coronary  artery calcifications. No pericardial effusion. Mediastinum/Nodes: No enlarged mediastinal, hilar, or axillary lymph nodes. Thyroid gland, trachea, and esophagus demonstrate no significant findings. There is a fat containing left-sided Bochdalek's type hernia. Lungs/Pleura: Small bilateral pleural effusions, right greater than left, with associated atelectasis or consolidation. Underlying moderate centrilobular emphysema. Upper Abdomen: No acute abnormality. Musculoskeletal: Scattered subcutaneous emphysema about the right chest wall, likely related to recent laparoscopic surgery. No acute or significant osseous findings. Review of the MIP images confirms the above findings. IMPRESSION: 1.  Negative examination for pulmonary embolism. 2. Small, right greater than left pleural effusions with associated atelectasis or consolidation. 3.  Emphysema. 4.  Coronary artery disease. 5. Scattered subcutaneous emphysema about the right chest wall, likely related to recent laparoscopic surgery. Electronically Signed   By: Lauralyn PrimesAlex  Bibbey M.D.   On: 11/30/2018 19:05   Dg Chest Port 1 View  Result Date: 12/02/2018 CLINICAL DATA:  Shortness of breath EXAM: PORTABLE CHEST 1 VIEW COMPARISON:  11/30/2018 FINDINGS: Cardiac shadow is stable. Aortic calcifications are noted. Bilateral pleural effusions are noted right greater than left. No definitive focal infiltrate is seen. No bony abnormality is noted. IMPRESSION: Bilateral pleural effusions right greater than left. Electronically Signed   By: Alcide CleverMark  Lukens M.D.   On: 12/02/2018 08:39   Scheduled Meds: . docusate sodium  100 mg Oral BID  . furosemide  40 mg Intravenous Once  . guaiFENesin  1,200 mg Oral BID  . levothyroxine  88 mcg Oral Q0600  . nicotine  21 mg Transdermal Daily  . sodium chloride flush  3 mL Intravenous Q12H  . umeclidinium-vilanterol  1 puff Inhalation Daily  . [START ON 12/03/2018] vitamin B-12  1,000 mcg Oral Daily   Continuous Infusions: . sodium  chloride    . sodium chloride      LOS: 2 days   Merlene Laughtermair Latif Zeb Rawl, DO [facility] PAGER is on AMION  If 7PM-7AM, please contact night-coverage [email or web address] Password [email or web address] 12/02/2018, 3:56 PM

## 2018-12-02 NOTE — Progress Notes (Signed)
Received from Endo at 1600. Sl drowsy and Oriented x 3. Gait sl unsteady to BR. Instructed not to get up by herself without assistance tonight. Bed alarm on as a reminder. Verbalized understanding. Callbell in reach. No c/o pain or discomfort.

## 2018-12-02 NOTE — Anesthesia Preprocedure Evaluation (Signed)
Anesthesia Evaluation  Patient identified by MRN, date of birth, ID band Patient awake    Reviewed: Allergy & Precautions, NPO status , Patient's Chart, lab work & pertinent test results  Airway Mallampati: II  TM Distance: >3 FB Neck ROM: Full    Dental no notable dental hx. (+) Missing, Poor Dentition   Pulmonary shortness of breath and with exertion, former smoker,  States now off cigarrettes for ~ 3 weeks  States has COPD -denies inhaler or o2 use    Pulmonary exam normal breath sounds clear to auscultation       Cardiovascular Exercise Tolerance: Good hypertension, +CHF  Normal cardiovascular examI Rhythm:Regular Rate:Normal  Reportedly diuresed and now off 02  Sats ~90-92 on RA   Neuro/Psych negative neurological ROS  negative psych ROS   GI/Hepatic negative GI ROS, Neg liver ROS, S/p attempted ERCP 6/3 S/p L/S chole 6/4  Here for another ERCP attempt today    Endo/Other  Hypothyroidism   Renal/GU negative Renal ROS  negative genitourinary   Musculoskeletal negative musculoskeletal ROS (+)   Abdominal   Peds negative pediatric ROS (+)  Hematology negative hematology ROS (+)   Anesthesia Other Findings   Reproductive/Obstetrics negative OB ROS                             Anesthesia Physical Anesthesia Plan  ASA: III  Anesthesia Plan: General   Post-op Pain Management:    Induction: Intravenous  PONV Risk Score and Plan:   Airway Management Planned: Oral ETT  Additional Equipment:   Intra-op Plan:   Post-operative Plan: Extubation in OR  Informed Consent: I have reviewed the patients History and Physical, chart, labs and discussed the procedure including the risks, benefits and alternatives for the proposed anesthesia with the patient or authorized representative who has indicated his/her understanding and acceptance.     Dental advisory given  Plan Discussed  with: CRNA  Anesthesia Plan Comments: (Plan Full PPE use  Plan GETA for semiprone positioning-WTP with same after Q&A)        Anesthesia Quick Evaluation

## 2018-12-02 NOTE — Progress Notes (Signed)
  Subjective:  Patient has no complaints.  She did get some sleep after receiving dose of zolpidem.  She denies chest pain or shortness of breath.  She complains of mild pain below the right rib cage along midclavicular line.  She says pain is lasted yesterday.  She denies epigastric pain nausea or vomiting.  No bowel movements today.  She is passing flatus.  Objective: Blood pressure 138/66, pulse 68, temperature 98.6 F (37 C), temperature source Oral, resp. rate 16, height _0  (1.626 m), weight 66.4 kg, SpO2 96 %. Patient is alert and in no acute distress. Cardiac exam with regular rhythm normal S1 and S2.  She has grade 2/6 systolic murmur best heard at aortic area. Lungs are clear to auscultation. Abdomen is symmetrical with multiple laparoscopy ports from recent cholecystectomy.  Bowel sounds are normal.  She has mild tenderness in mid epigastrium and along the most lateral laparoscopy port.  No organomegaly or masses. No LE edema or clubbing noted.  Labs/studies Results:  CBC Latest Ref Rng & Units 12/02/2018 12/01/2018 11/30/2018  WBC 4.0 - 10.5 K/uL 10.6(H) 9.5 8.8  Hemoglobin 12.0 - 15.0 g/dL 11.6(L) 10.1(L) 12.4  Hematocrit 36.0 - 46.0 % 36.0 30.9(L) 38.9  Platelets 150 - 400 K/uL 418(H) 347 389    CMP Latest Ref Rng & Units 12/02/2018 12/02/2018 12/01/2018  Glucose 70 - 99 mg/dL - 103(H) 95  BUN 8 - 23 mg/dL - 8 7(L)  Creatinine 0.44 - 1.00 mg/dL - 0.72 0.58  Sodium 135 - 145 mmol/L - 136 137  Potassium 3.5 - 5.1 mmol/L - 3.4(L) 3.8  Chloride 98 - 111 mmol/L - 98 105  CO2 22 - 32 mmol/L - 28 25  Calcium 8.9 - 10.3 mg/dL - 8.6(L) 8.1(L)  Total Protein 6.5 - 8.1 g/dL 6.2(L) 6.2(L) 5.3(L)  Total Bilirubin 0.3 - 1.2 mg/dL 0.9 1.1 0.8  Alkaline Phos 38 - 126 U/L 279(H) 272(H) 299(H)  AST 15 - 41 U/L 37 37 67(H)  ALT 0 - 44 U/L 61(H) 61(H) 75(H)    Hepatic Function Latest Ref Rng & Units 12/02/2018 12/02/2018 12/01/2018  Total Protein 6.5 - 8.1 g/dL 6.2(L) 6.2(L) 5.3(L)  Albumin 3.5 - 5.0  g/dL 2.6(L) 2.6(L) 2.2(L)  AST 15 - 41 U/L 37 37 67(H)  ALT 0 - 44 U/L 61(H) 61(H) 75(H)  Alk Phosphatase 38 - 126 U/L 279(H) 272(H) 299(H)  Total Bilirubin 0.3 - 1.2 mg/dL 0.9 1.1 0.8  Bilirubin, Direct 0.0 - 0.2 mg/dL 0.2 - -     Serum iron 27, TIBC 313 and saturation 9%. Serum ferritin 96. Serum B12 55. Serum folate 15.3. Reticulocyte count 2.7%.  Serum phosphorus 3.9. Serum magnesium 1.9.  Assessment:  #1.  Choledocholithiasis.  ALT and alkaline phosphatase remain elevated.  AST and bilirubin are normal.  She is not having biliary type of pain.  We will undergo ERCP with sphincterotomy and stone extraction later today.  #2.  Anemia.  B12 level is very low.  Iron studies are more consistent with chronic disease anemia.  B12 replacement per Dr. Alfredia Ferguson.  #3.  CHF felt to be due to diastolic dysfunction.  She received diuretic yesterday.  She is now symptom-free.  She also has mild aortic incompetence with normal LV systolic function.  Recommendations:  ERCP with biliary sphincterotomy and stone extraction later today. Vitamin B-12 dosing per Dr. Alfredia Ferguson. May also consider p.o. iron at time of discharge.

## 2018-12-02 NOTE — Progress Notes (Signed)
Ambulated on RA -pulse ox 92%. No c/o SOB.

## 2018-12-02 NOTE — Transfer of Care (Signed)
Immediate Anesthesia Transfer of Care Note  Patient: Sherry Bridges  Procedure(s) Performed: ENDOSCOPIC RETROGRADE CHOLANGIOPANCREATOGRAPHY (ERCP) (N/A ) BILIARY SPHINCTEROTOMY BALLOON DILATION BILIARY STENT PLACEMENT  Patient Location: PACU  Anesthesia Type:General  Level of Consciousness: awake and patient cooperative  Airway & Oxygen Therapy: Patient Spontanous Breathing and Patient connected to nasal cannula oxygen  Post-op Assessment: Report given to RN, Post -op Vital signs reviewed and stable and Patient moving all extremities  Post vital signs: Reviewed and stable  Last Vitals:  Vitals Value Taken Time  BP 131/66 12/02/2018  2:37 PM  Temp 36.4 C 12/02/2018  2:37 PM  Pulse 98 12/02/2018  2:39 PM  Resp 25 12/02/2018  2:39 PM  SpO2 95 % 12/02/2018  2:39 PM  Vitals shown include unvalidated device data.  Last Pain:  Vitals:   12/02/18 1126  TempSrc: Oral  PainSc: 0-No pain         Complications: No apparent anesthesia complications

## 2018-12-02 NOTE — Progress Notes (Signed)
Patient has no complaints. She denies abdominal pain shortness of breath or chest pain. Patient's daughter Ms. Sherry Bridges called and advised that she will be observed overnight and most likely will be discharged on 12/03/2018

## 2018-12-02 NOTE — Op Note (Signed)
[facility] Patient Name: Alfonso RamusGainelle Outten Procedure Date: 12/02/2018 1:17 PM MRN: [id number] Date of Birth: 08-30-1940 Attending MD: Lionel DecemberNajeeb Rehman , MD CSN: [id number] Age: [age] Admit Type: Inpatient Procedure:                ERCP Indications:              For therapy of bile duct stone(s) Providers:                Lionel DecemberNajeeb Rehman, MD, Judee ClaraPamela Shreve, RN, Burke Keelsrisann                            Tilley, Technician Referring MD:             Merlene Laughtermair Latif Sheikh, MD Medicines:                General Anesthesia Complications:            No immediate complications. Estimated Blood Loss:     Estimated blood loss was minimal. Procedure:                Pre-Anesthesia Assessment:                           - Prior to the procedure, a History and Physical                            was performed, and patient medications and                            allergies were reviewed. The patient's tolerance of                            previous anesthesia was also reviewed. The risks                            and benefits of the procedure and the sedation                            options and risks were discussed with the patient.                            All questions were answered, and informed consent                            was obtained. Prior Anticoagulants: The patient                            last took Lovenox (enoxaparin) 1 day prior to the                            procedure. ASA Grade Assessment: III - A patient                            with severe systemic disease. After reviewing the  risks and benefits, the patient was deemed in                            satisfactory condition to undergo the procedure.                           After obtaining informed consent, the scope was                            passed under direct vision. Throughout the                            procedure, the patient's blood pressure, pulse, and                            oxygen  saturations were monitored continuously. The                            [id number]-[id number] ([id number] scope was introduced through                            the and used to inject contrast into and used to                            cannulate the bile duct. The ERCP was accomplished                            without difficulty. The patient tolerated the                            procedure well. Scope In: Scope Out: Findings:      A pancreatic stent was not visible on the scout film. The esophagus was       successfully intubated under direct vision. The scope was advanced to a       normal major papilla in the descending duodenum without detailed       examination of the pharynx, larynx and associated structures, and upper       GI tract. The upper GI tract was grossly normal. The bile duct was       deeply cannulated with the Hydratome sphincterotome. Contrast was       injected. I personally interpreted the bile duct images. There was brisk       flow of contrast through the ducts. Image quality was adequate. Contrast       extended to the bifurcation. CBD and CHD were dilated. CBD>CHD. The       hepatic duct bifurcation contained filling defect(s) thought to be a       stone. A 7 mm biliary sphincterotomy was made with a braided Hydratome       sphincterotome using ERBE electrocautery. There was self limited oozing       from the sphincterotomy which did not require treatment. To discover       objects, the biliary tree was swept with a 9 mm balloon, 12 mm balloon       and basket starting at the bifurcation. stones could not be removed One  10 Fr by 7 cm plastic stent with a single external flap and a single       internal flap was placed 6 cm into the common bile duct. Bile and clear       fluid flowed through the stent. The stent was in good position. Impression:               - Pancreatic stent sponteously passed.                           - Three filling defects consistent with  stones                            wwere seen on the cholangiogram. Stones wera at                            hilum.                           - A biliary sphincterotomy was performed and                            dilated to 8 mm.                           - The biliary tree was swept with balloon and                            basket but stones could not be removed,                           - One plastic stent was placed into the common bile                            duct. Moderate Sedation:      Per Anesthesia Care Recommendation:           - Return patient to hospital ward for ongoing care.                           - Avoid aspirin and nonsteroidal anti-inflammatory                            medicines for 3 days.                           - Clear liquid diet today.                           - Continue present medications.                           - Repeat ERCP in 8 weeks or so. Procedure Code(s):        --- Professional ---                           [phone], Endoscopic retrograde  cholangiopancreatography (ERCP); with placement of                            endoscopic stent into biliary or pancreatic duct,                            including pre- and post-dilation and guide wire                            passage, when performed, including sphincterotomy,                            when performed, each stent Diagnosis Code(s):        --- Professional ---                           K80.50, Calculus of bile duct without cholangitis                            or cholecystitis without obstruction                           R93.2, Abnormal findings on diagnostic imaging of                            liver and biliary tract CPT copyright 2019 American Medical Association. All rights reserved. The codes documented in this report are preliminary and upon coder review may  be revised to meet current compliance requirements. Lionel DecemberNajeeb Rehman, MD Lionel DecemberNajeeb Rehman,  MD 12/02/2018 2:55:52 PM This report has been signed electronically. Number of Addenda: 0

## 2018-12-03 ENCOUNTER — Inpatient Hospital Stay (HOSPITAL_COMMUNITY): Payer: Medicare Other

## 2018-12-03 LAB — CBC WITH DIFFERENTIAL/PLATELET
Abs Immature Granulocytes: 0.28 10*3/uL — ABNORMAL HIGH (ref 0.00–0.07)
Basophils Absolute: 0.1 10*3/uL (ref 0.0–0.1)
Basophils Relative: 1 %
Eosinophils Absolute: 0.1 10*3/uL (ref 0.0–0.5)
Eosinophils Relative: 1 %
HCT: 39.1 % (ref 36.0–46.0)
Hemoglobin: 12.3 g/dL (ref 12.0–15.0)
Immature Granulocytes: 3 %
Lymphocytes Relative: 25 %
Lymphs Abs: 2.7 10*3/uL (ref 0.7–4.0)
MCH: 26.9 pg (ref 26.0–34.0)
MCHC: 31.5 g/dL (ref 30.0–36.0)
MCV: 85.4 fL (ref 80.0–100.0)
Monocytes Absolute: 1.6 10*3/uL — ABNORMAL HIGH (ref 0.1–1.0)
Monocytes Relative: 15 %
Neutro Abs: 5.8 10*3/uL (ref 1.7–7.7)
Neutrophils Relative %: 55 %
Platelets: 493 10*3/uL — ABNORMAL HIGH (ref 150–400)
RBC: 4.58 MIL/uL (ref 3.87–5.11)
RDW: 14.6 % (ref 11.5–15.5)
WBC: 10.5 10*3/uL (ref 4.0–10.5)
nRBC: 0 % (ref 0.0–0.2)

## 2018-12-03 LAB — COMPREHENSIVE METABOLIC PANEL
ALT: 39 U/L (ref 0–44)
AST: 26 U/L (ref 15–41)
Albumin: 2.7 g/dL — ABNORMAL LOW (ref 3.5–5.0)
Alkaline Phosphatase: 251 U/L — ABNORMAL HIGH (ref 38–126)
Anion gap: 11 (ref 5–15)
BUN: 11 mg/dL (ref 8–23)
CO2: 26 mmol/L (ref 22–32)
Calcium: 9 mg/dL (ref 8.9–10.3)
Chloride: 99 mmol/L (ref 98–111)
Creatinine, Ser: 0.77 mg/dL (ref 0.44–1.00)
GFR calc Af Amer: >60 mL/min (ref >60)
GFR calc non Af Amer: >60 mL/min (ref >60)
Glucose, Bld: 85 mg/dL (ref 70–99)
Potassium: 3.9 mmol/L (ref 3.5–5.1)
Sodium: 136 mmol/L (ref 135–145)
Total Bilirubin: 1.3 mg/dL — ABNORMAL HIGH (ref 0.3–1.2)
Total Protein: 6.5 g/dL (ref 6.5–8.1)

## 2018-12-03 LAB — PHOSPHORUS: Phosphorus: 3.9 mg/dL (ref 2.5–4.6)

## 2018-12-03 LAB — MAGNESIUM: Magnesium: 2.1 mg/dL (ref 1.7–2.4)

## 2018-12-03 LAB — AMYLASE: Amylase: 92 U/L (ref 28–100)

## 2018-12-03 MED ORDER — GUAIFENESIN ER 600 MG PO TB12: 1200.0000 mg | ORAL_TABLET | Freq: Two times a day (BID) | ORAL | 0 refills | Status: AC

## 2018-12-03 MED ORDER — NAPROXEN SODIUM 220 MG PO TABS: 220.0000 mg | ORAL_TABLET | Freq: Every day | ORAL | Status: DC | PRN

## 2018-12-03 MED ORDER — ASPIRIN EC 81 MG PO TBEC: 81.0000 mg | DELAYED_RELEASE_TABLET | Freq: Every day | ORAL | Status: AC

## 2018-12-03 MED ORDER — FUROSEMIDE 20 MG PO TABS: ORAL_TABLET | ORAL | 0 refills | Status: DC

## 2018-12-03 MED ORDER — UMECLIDINIUM-VILANTEROL 62.5-25 MCG/INH IN AEPB: 1.0000 | INHALATION_SPRAY | Freq: Every day | RESPIRATORY_TRACT | 0 refills | Status: AC

## 2018-12-03 MED ORDER — CYANOCOBALAMIN 1000 MCG PO TABS: 1000.0000 ug | ORAL_TABLET | Freq: Every day | ORAL | 1 refills | Status: AC

## 2018-12-03 MED ORDER — FUROSEMIDE 20 MG PO TABS
20.0000 mg | ORAL_TABLET | Freq: Every day | ORAL | Status: DC
  Administered 2018-12-03: 20 mg via ORAL
  Filled 2018-12-03: qty 1

## 2018-12-03 NOTE — TOC Transition Note (Addendum)
Transition of Care [facility]) - CM/SW Discharge Note   Patient Details  Name: Sherry Bridges MRN: [id number] Date of Birth: Mar 31, 1941  [facility] CM/SW Contact:  Boneta Lucks, RN Phone Number: 12/03/2018, 11:21 AM   Clinical Narrative:   Patient ready for discharge, Lives and home with husband. They have supportive children. Follow -up appointments made.  No needs from TOC.     Final next level of care: Home/Self Care Barriers to Discharge: No Barriers Identified   Patient Goals and CMS Choice        Discharge Placement                                                        Social Determinants of Health (SDOH) Interventions     Readmission Risk Interventions Readmission Risk Prevention Plan 12/03/2018 12/02/2018  Post Dischage Appt - Complete  Medication Screening Complete -  Transportation Screening - Complete  Some recent data might be hidden

## 2018-12-03 NOTE — Discharge Instructions (Signed)
Please hold your aspirin for the next 2 days and you can restart taking your aspirin on 12/05/2018. Heart healthy diet recommended. Please follow-up with cardiology and GI as scheduled. Please discuss starting B12 injections with your primary care provider.  Please take over the counter B12 tablets 1000 mcg daily.   IMPORTANT INFORMATION: PAY CLOSE ATTENTION   PHYSICIAN DISCHARGE INSTRUCTIONS  Follow with Primary care provider  Neale Burly, MD  and other consultants as instructed your Hospitalist Physician  SEEK MEDICAL CARE OR RETURN TO EMERGENCY ROOM IF SYMPTOMS COME BACK, WORSEN OR NEW PROBLEM DEVELOPS.   Please note: You were cared for by a hospitalist during your hospital stay. Every effort will be made to forward records to your primary care provider.  You can request that your primary care provider send for your hospital records if they have not received them.  Once you are discharged, your primary care physician will handle any further medical issues. Please note that NO REFILLS for any discharge medications will be authorized once you are discharged, as it is imperative that you return to your primary care physician (or establish a relationship with a primary care physician if you do not have one) for your post hospital discharge needs so that they can reassess your need for medications and monitor your lab values.  Please get a complete blood count and chemistry panel checked by your Primary MD at your next visit, and again as instructed by your Primary MD.  Get Medicines reviewed and adjusted: Please take all your medications with you for your next visit with your Primary MD  Laboratory/radiological data: Please request your Primary MD to go over all hospital tests and procedure/radiological results at the follow up, please ask your primary care provider to get all Hospital records sent to his/her office.  In some cases, they will be blood work, cultures and biopsy results  pending at the time of your discharge. Please request that your primary care provider follow up on these results.  If you are diabetic, please bring your blood sugar readings with you to your follow up appointment with primary care.    Please call and make your follow up appointments as soon as possible.    Also Note the following: If you experience worsening of your admission symptoms, develop shortness of breath, life threatening emergency, suicidal or homicidal thoughts you must seek medical attention immediately by calling 911 or calling your MD immediately  if symptoms less severe.  You must read complete instructions/literature along with all the possible adverse reactions/side effects for all the Medicines you take and that have been prescribed to you. Take any new Medicines after you have completely understood and accpet all the possible adverse reactions/side effects.   Do not drive when taking Pain medications or sleeping medications (Benzodiazepines)  Do not take more than prescribed Pain, Sleep and Anxiety Medications. It is not advisable to combine anxiety,sleep and pain medications without talking with your primary care practitioner  Special Instructions: If you have smoked or chewed Tobacco  in the last 2 yrs please stop smoking, stop any regular Alcohol  and or any Recreational drug use.  Wear Seat belts while driving.

## 2018-12-03 NOTE — Discharge Summary (Addendum)
Physician Discharge Summary  Sherry Bridges:[id number] DOB: 11/04/1940 DOA: 11/30/2018  PCP: Toma DeitersHasanaj, Xaje A, MD Cardiology: Diona BrownerMcDowell GIElnora Morrison: Rehman Boyd: Pulmonary   Admit date: 11/30/2018 Discharge date: 12/03/2018  Admitted From:   Home  Disposition: Home   Recommendations for Outpatient Follow-up:  1. Follow up with PCP, GI and cardiology as scheduled below 2. Please consider starting B12 injections.  Check B12 levels in 1 month.   Discharge Condition: STABLE   CODE STATUS: FULL    Brief Hospitalization Summary: Please see all hospital notes, images, labs for full details of the hospitalization. HPI: Dr. Pixie CasinoJ. Bridges:  Sherry RamusGainelle Bridges  is a 78 y.o. female, hypertension, hyperlipidemia, hypothyroidism, former smoker, Copd (not on home o2),  [facility]stit[facility]  , and [facility].  Pt taken for ERCP on 11/26/2018 unsuccesfull as common bile duct could not be cannulated due to small ampullary orifice, plastic stent placed in main pancreatic duct for prophylactic purposes. Lap chole by [name] on 11/27/2018.  Pt was discharged on 6/4 Thursday and presented today due to c/o dyspnea today as well as low pox. Pt denies fever, chills, cough, cp, palp, n/v, abd pain, diarrhea, brbpr, dysuria. Pt was formerly taking lorazepam but not recently and denies any oxycodone use today.    In ED,  T 98  P 73  R 22, Bp 143/65  Pox 93% on RA  CTA chest IMPRESSION: 1. Negative examination for pulmonary embolism. 2. Small, right greater than left pleural effusions with associated atelectasis or consolidation. 3. Emphysema. 4. Coronary artery disease. 5. Scattered subcutaneous emphysema about the right chest wall, likely related to recent laparoscopic surgery.  Wbc 8.8, Hgb 12.4, Plt 389   Na 139, K 3.7  Bun 8 , creatinine 0.72   Glucose 127 Trop 0.05  EKG nsr at 75, nl axis, nl pr, nl qt int, no st-t changes c/w ischemia.   Pt will be  admitted for c/o dyspnea, low pox, as well as Copd , bilateral pleural effusions R>L and possibly HCAP.   Was diuresed and underwent echocardiogram which showed diastolic dysfunction.  She is improved respiratory wise and was no longer requiring supplemental oxygen via nasal cannula and O2 saturations were in the 90s.  We will repeat diuresis tonight and patient was started on medications for COPD.  Will need outpatient PFTs.  Assessment & Plan:   Active Problems:   Hypothyroidism   Essential hypertension   Hyperlipidemia   Dyspnea   Abnormal liver function   Elevated troponin   Acute respiratory failure with hypoxia (HCC)  Acute Respiratory Failure with Hypoxia secondary to acute Diastolic CHF exacerbation with questionable COPD component, improving  -Patient is currently afebrile and has no leukocytosis -Was having desaturations with home pulse oximetry with oxygen dropping to the low 80s along with dyspnea on exertion -Chest x-ray showed suggestion of mild vascular congestion and there are small bilateral pleural effusions with associated bibasilar atelectasis.  Infection in the lung bases was possible however do not feel that this is truly an infection likely this is atelectasis -Because of elevated d-dimer 4.79 a CTA of the chest was done and showed "Negative examination for pulmonary embolism. Small, right greater than left pleural effusions with associated atelectasis or consolidation. Emphysema. Coronary artery disease. Scattered subcutaneous emphysema about the right chest wall, likely related to recent laparoscopic surgery." -BNP on admission was 228  -SARS-CoV-2 testing was negative -Echocardiogram done and showed an EF of 60  to 65% but did diastolic dysfunction which is new for the patient -Diuresed with IV Lasix 40 mg twice yesterday and will get another IV dose of Lasix this Afternoon per Cardiology  -Patient is currently off of oxygen now but per my Discussion with  Gastroenterology her Sats dropped again spontaneously in Pre-op  -Continuous pulse oximetry and maintain O2 saturations greater than 90% -Continue with supplemental oxygen via nasal cannula and wean O2 as tolerated currently is not on any -We will add incentive spirometry, flutter valve and guaifenesin 1200 mg p.o. twice daily -Check Ambulatory Home O2 screen and did not desaturate this AM but will repeat again tomorrow  -Repeat chest this AM showed "Cardiac shadow is stable. Aortic calcifications are noted. Bilateral pleural effusions are noted right greater than left. No definitive focal infiltrate is seen. No bony abnormality is noted."  Acute Diastolic CHF Exacerbation -Echo showed EF of 60 to 65% with diastolic dysfunction -BNP on admission was 228 -Troponins were cycled and they were minimally elevated but flat and not really indicative of ACS -Patient not complaining of any chest pain and shortness of breath is improved with diuresis -Patient was dyspneic on exertion but that has RESOLVED.  -We monitored strict I's and O's, daily weights and fluid restriction -Continue to work to control blood pressure adequately -CTA of the chest did show some coronary artery disease and she will benefit from an outpatient cardiology evaluation.  -Cardiology evaluated and arranged outpatient follow up.  -They will consider outpatient Stress Testing in the future one she is recovered from these procedures. -Continue to Monitor Volume Status Carefully and avoid Volume Overload on outpatient basis.  -Ambulatory Home O2 Screen Prior to D/C  Bilateral Pleural Effusions  -Right >Left (seems too small to tap) and seen on CTA  -Checked Transthoracic ECHO as below -Antibiotics Never Started -Cardiology recommending lasix 20 mg daily x 5 days, then as needed for fluid buildup. Pt has outpatient cardiology follow up arranged.  -Repeated CXR shows stable effusions.  Follow up outpatient.   COPD with  Emphysema -Started on Umeclidinium-Vilanterol 62.5-25 mcg/IH 1 puff Inhalation Daily  -Started Albuterol Neb 2.5 mg q6h prn  -Recommended outpatient pulmonary follow up to arrange PFTs as outpatient  Elevated Troponin -In the setting of Demand Ischemia from CHF Exacerbation and Hypoxia  -Trended Troponin and they were 0.05 -> 0.05 -> 0.04 -> 0.04 and Flat and not indicative of ACS -C/w Telemetry Monitoring  -Checked Cardiac ECHOCardiogram as below; showed "The left ventricle has normal systolic function, with an ejection fraction of 60-65%. The cavity size was normal. There is no increase in left ventricular wall thickness. Left ventricular diastolic Doppler parameters are consistent with impaired relaxation. No evidence of left ventricular regional wall motion abnormalities." -Denies any CP  -EKG showed a Sinus Rhythm at 74 and was non-ischemic   H/o Acute cholecystitis w Lap Chole 11/27/2018, ERCP w pancreatic duct plastic stent 11/26/2018 -Had Unsuccessful ERCP on 11/26/2018 and then had Cholecystectomy on 11/27/2018 -Surgery Consulted and recommends no Surgical Interventions at this time -Plan was for repeat ERCP with Biliary Sphincterotomy and Stone Extraction as well as removal of Pancreatic Plastic Stent this AM; -She underwent ERCP 12/02/18  and she had mild ampullary edema and is noted that her pancreatic stent had spontaneously passed.  Her common bile duct was cannulated without difficulty and it was seen that her common bile duct and common hepatic duct were dilated with the common bile duct being greater with 3 stones in  the region of the hilum.  A sphincterotomy was performed and sphincter mildly dilated to 8 mm.  Per report the stones could not be removed with basket or balloon so a 7 cm [place] plastic stent was left in place and patient is to return for outpatient ERCP in 8 weeks or so -I discussed the case with gastroenterology and patient can discharge home today with close outpatient  follow up.   Abnormal LFTs -Trending down as AST went from 136 -> 67 -> 37 and ALT went from 116 -> 75 -> 61 -Acute Hepatitis Panel Negative -Stopped Atorvastatin due to Abnormal LFTs -Continue to Monitor and Trend and Repeat CMP in AM   Hypothyroidism -TSH last Admission was 1.674 -Contine Levothyroxine 88 mcg po Daily   H/o Hypertension -Continue to Monitor BP Closely; Last value was 141/66 -Started Hydralazine 5 mg IV q6h prn SBP >160 or DBP >100  Hyperlipidemia -Because of Abnormal LFTs, Atorvastatin has been currently stopped but LAFTs are trending down now and likely can be resumed in the next couple of days   Tobacco Abuse -Smoking Cessation Counseling given -C/w Nicotine 21 mg TD q24h  Pre-Diabetes -Patient's HbA1c was 5.9 -Outpatient follow up.   Normocytic Anemia -Patient's Hb/Hct went from 12.4/38.9 on admission -> 10.1/30.9 -> 11.6/36.0 -? Hemoconcentration on Admission -Checked Anemia Panel and showed a level of 27, U IBC of 286, TIBC of 313, saturation ratios 9%, ferritin level 96, folate level 15.3, vitamin B12 of 55 -We will start vitamin B12 supplementation and will give 1000 mcg IM injection and then start 1000 mcg p.o. daily -Patient will also benefit from iron supplementation and will start iron polysaccharide 150 mg daily -Continue to Monitor for S/Sx of Bleeding; Currently no Overt Bleeding Noted  Hypokalemia -Repleted.   DVT prophylaxis: Enoxaparin 40 mg sq q24h Code Status: FULL CODE Disposition Plan: HOME   Consultants:   Gastroenterology  General Surgery  Cardiology    Procedures:  ECHOCARDIOGRAM IMPRESSIONS   1. The left ventricle has normal systolic function with an ejection fraction of 60-65%. The cavity size was normal. Left ventricular diastolic Doppler parameters are consistent with impaired relaxation. No evidence of left ventricular regional wall  motion abnormalities. 2. The right ventricle has normal systolic  function. The cavity was normal. There is no increase in right ventricular wall thickness. 3. The aortic valve is tricuspid. Aortic valve regurgitation is mild by color flow Doppler. Mild aortic annular calcification noted. 4. The mitral valve is grossly normal. 5. The tricuspid valve is grossly normal. There is mild tricuspid regurgitation. 6. The aortic root is normal in size and structure.  FINDINGS Left Ventricle: The left ventricle has normal systolic function, with an ejection fraction of 60-65%. The cavity size was normal. There is no increase in left ventricular wall thickness. Left ventricular diastolic Doppler parameters are consistent with  impaired relaxation. No evidence of left ventricular regional wall motion abnormalities..  Right Ventricle: The right ventricle has normal systolic function. The cavity was normal. There is no increase in right ventricular wall thickness.  Left Atrium: Left atrial size was normal in size.  Right Atrium: Right atrial size was normal in size. Right atrial pressure is estimated at 3 mmHg.  Interatrial Septum: No atrial level shunt detected by color flow Doppler.  Pericardium: There is no evidence of pericardial effusion.  Mitral Valve: The mitral valve is grossly normal. Mitral valve regurgitation is trivial by color flow Doppler.  Tricuspid Valve: The tricuspid valve is grossly normal.  Tricuspid valve regurgitation is mild by color flow Doppler.  Aortic Valve: The aortic valve is tricuspid Aortic valve regurgitation is mild by color flow Doppler. Mild aortic annular calcification noted.  Pulmonic Valve: The pulmonic valve was grossly normal. Pulmonic valve regurgitation is trivial by color flow Doppler.  Aorta: The aortic root is normal in size and structure.  Discharge Diagnoses:  Active Problems:   Hypothyroidism   Essential hypertension   Hyperlipidemia   Dyspnea   Abnormal liver function   Elevated troponin    Acute respiratory failure with hypoxia [facility])   Discharge Instructions: Discharge Instructions    (HEART FAILURE PATIENTS) Call MD:  Anytime you have any of the following symptoms: 1) 3 pound weight gain in 24 hours or 5 pounds in 1 week 2) shortness of breath, with or without a dry hacking cough 3) swelling in the hands, feet or stomach 4) if you have to sleep on extra pillows at night in order to breathe.   Complete by:  As directed    Call MD for:  difficulty breathing, headache or visual disturbances   Complete by:  As directed    Call MD for:  extreme fatigue   Complete by:  As directed    Call MD for:  persistant dizziness or light-headedness   Complete by:  As directed    Call MD for:  persistant nausea and vomiting   Complete by:  As directed    Call MD for:  severe uncontrolled pain   Complete by:  As directed    Diet - low sodium heart healthy   Complete by:  As directed    Increase activity slowly   Complete by:  As directed      Allergies as of 12/03/2018   No Known Allergies     Medication List    TAKE these medications   aspirin EC 81 MG tablet Take 1 tablet (81 mg total) by mouth daily. Start taking on:  December 05, 2018   atorvastatin 40 MG tablet Commonly known as:  LIPITOR Take 40 mg by mouth daily at 6 PM.   co-enzyme Q-10 30 MG capsule Take 30 mg by mouth daily.   cyanocobalamin 1000 MCG tablet Take 1 tablet (1,000 mcg total) by mouth daily. Start taking on:  December 04, 2018   docusate sodium 100 MG capsule Commonly known as:  Colace Take 1 capsule (100 mg total) by mouth 2 (two) times daily.   Euthyrox 88 MCG tablet Generic drug:  levothyroxine Take 88 mcg by mouth daily.   Fish Oil 1000 MG Caps Take 1 capsule by mouth daily.   furosemide 20 MG tablet Commonly known as:  LASIX Take 1 tablet every day for 5 days then take 1 tablet daily ONLY as needed  for fluid buildup   guaiFENesin 600 MG 12 hr tablet Commonly known as:  MUCINEX Take 2  tablets (1,200 mg total) by mouth 2 (two) times daily for 5 days.   multivitamin tablet Take 1 tablet by mouth daily.   naproxen sodium 220 MG tablet Commonly known as:  ALEVE Take 1 tablet (220 mg total) by mouth daily as needed (FOR PAIN). Start taking on:  December 05, 2018 What changed:  These instructions start on December 05, 2018. If you are unsure what to do until then, ask your doctor or other care provider.   ondansetron 4 MG tablet Commonly known as:  Zofran Take 1 tablet (4 mg total) by mouth daily as needed  for nausea or vomiting.   oxyCODONE 5 MG immediate release tablet Commonly known as:  Oxy IR/ROXICODONE Take 1 tablet (5 mg total) by mouth every 4 (four) hours as needed for moderate pain. What changed:    how much to take  when to take this  additional instructions   umeclidinium-vilanterol 62.5-25 MCG/INH Aepb Commonly known as:  ANORO ELLIPTA Inhale 1 puff into the lungs daily for 30 days. Start taking on:  December 04, 2018      Follow-up Information    Toma DeitersHasanaj, Xaje A, MD Follow up on 12/09/2018.   Specialty:  Internal Medicine Why:  Please arrive at 10:45am for an 11:00am appointment.  Contact information: [address] [place] [id number] [phone]        Loleta ChanceBoyd, Michael, MD. Schedule an appointment as soon as possible for a visit in 2 week(s).   Specialty:  Pulmonary Disease Why:  Call for Outpatient PFT's Contact information: [address] [place] [id number] [phone]        Ellsworth LennoxStrader, Brittany M, PA-C Follow up on 12/19/2018.   Specialties:  Physician Assistant, Cardiology Why:  Cardiology Follow-Up on 12/19/2018 at 3:00 PM.  Contact information: [address] [place] [address] [phone]        [facility]. Schedule an appointment as soon as possible for a visit in 1 month(s).   Specialty:  Gastroenterology Contact information: [address] [place]  [id number] [phone]         No Known Allergies Allergies as of 12/03/2018   No Known Allergies     Medication List    TAKE these medications   aspirin EC 81 MG tablet Take 1 tablet (81 mg total) by mouth daily. Start taking on:  December 05, 2018   atorvastatin 40 MG tablet Commonly known as:  LIPITOR Take 40 mg by mouth daily at 6 PM.   co-enzyme Q-10 30 MG capsule Take 30 mg by mouth daily.   cyanocobalamin 1000 MCG tablet Take 1 tablet (1,000 mcg total) by mouth daily. Start taking on:  December 04, 2018   docusate sodium 100 MG capsule Commonly known as:  Colace Take 1 capsule (100 mg total) by mouth 2 (two) times daily.   Euthyrox 88 MCG tablet Generic drug:  levothyroxine Take 88 mcg by mouth daily.   Fish Oil 1000 MG Caps Take 1 capsule by mouth daily.   furosemide 20 MG tablet Commonly known as:  LASIX Take 1 tablet every day for 5 days then take 1 tablet daily ONLY as needed  for fluid buildup   guaiFENesin 600 MG 12 hr tablet Commonly known as:  MUCINEX Take 2 tablets (1,200 mg total) by mouth 2 (two) times daily for 5 days.   multivitamin tablet Take 1 tablet by mouth daily.   naproxen sodium 220 MG tablet Commonly known as:  ALEVE Take 1 tablet (220 mg total) by mouth daily as needed (FOR PAIN). Start taking on:  December 05, 2018 What changed:  These instructions start on December 05, 2018. If you are unsure what to do until then, ask your doctor or other care provider.   ondansetron 4 MG tablet Commonly known as:  Zofran Take 1 tablet (4 mg total) by mouth daily as needed for nausea or vomiting.   oxyCODONE 5 MG immediate release tablet Commonly known as:  Oxy IR/ROXICODONE Take 1 tablet (5 mg total) by mouth every 4 (four)  hours as needed for moderate pain. What changed:    how much to take  when to take this  additional instructions   umeclidinium-vilanterol 62.5-25 MCG/INH Aepb Commonly known as:  ANORO ELLIPTA Inhale 1 puff into the lungs  daily for 30 days. Start taking on:  December 04, 2018       Procedures/Studies: Dg Chest 2 View  Result Date: 11/30/2018 CLINICAL DATA:  Shortness of breath and hypoxia. EXAM: CHEST - 2 VIEW COMPARISON:  None. FINDINGS: Lungs are adequately inflated demonstrate small bilateral pleural effusions likely with associated bibasilar atelectasis. Minimal prominence of the perihilar vessels suggesting mild degree of vascular congestion. Cardiomediastinal silhouette is unremarkable. There are degenerative changes of the spine. IMPRESSION: Suggestion of mild vascular congestion. Small bilateral pleural effusions with associated bibasilar atelectasis. Infection in the lung bases is possible. Electronically Signed   By: Elberta Fortis M.D.   On: 11/30/2018 17:02   Ct Angio Chest Pe W And/or Wo Contrast  Result Date: 11/30/2018 CLINICAL DATA:  Hypoxia, recent cholecystectomy EXAM: CT ANGIOGRAPHY CHEST WITH CONTRAST TECHNIQUE: Multidetector CT imaging of the chest was performed using the standard protocol during bolus administration of intravenous contrast. Multiplanar CT image reconstructions and MIPs were obtained to evaluate the vascular anatomy. CONTRAST:  <See Chart> OMNIPAQUE IOHEXOL 350 MG/ML SOLN COMPARISON:  Chest radiograph, 11/30/2018 FINDINGS: Cardiovascular: Satisfactory opacification of the pulmonary arteries to the segmental level. No evidence of pulmonary embolism. Aortic atherosclerosis. Normal heart size. Three-vessel coronary artery calcifications. No pericardial effusion. Mediastinum/Nodes: No enlarged mediastinal, hilar, or axillary lymph nodes. Thyroid gland, trachea, and esophagus demonstrate no significant findings. There is a fat containing left-sided Bochdalek's type hernia. Lungs/Pleura: Small bilateral pleural effusions, right greater than left, with associated atelectasis or consolidation. Underlying moderate centrilobular emphysema. Upper Abdomen: No acute abnormality. Musculoskeletal:  Scattered subcutaneous emphysema about the right chest wall, likely related to recent laparoscopic surgery. No acute or significant osseous findings. Review of the MIP images confirms the above findings. IMPRESSION: 1.  Negative examination for pulmonary embolism. 2. Small, right greater than left pleural effusions with associated atelectasis or consolidation. 3.  Emphysema. 4.  Coronary artery disease. 5. Scattered subcutaneous emphysema about the right chest wall, likely related to recent laparoscopic surgery. Electronically Signed   By: Lauralyn Primes M.D.   On: 11/30/2018 19:05   Dg Chest Port 1 View  Result Date: 12/03/2018 CLINICAL DATA:  Shortness of breath. EXAM: PORTABLE CHEST 1 VIEW COMPARISON:  One-view chest x-ray 12/02/2018 FINDINGS: Heart size is normal. Atherosclerotic changes are noted at the aortic arch. Chronic interstitial coarsening is again noted. Small bilateral pleural effusions are stable. Bibasilar airspace disease likely reflects atelectasis. IMPRESSION: 1. Stable appearance of small bilateral pleural effusions. 2. Associated airspace disease likely reflects atelectasis. Electronically Signed   By: Marin Roberts M.D.   On: 12/03/2018 09:07   Dg Chest Port 1 View  Result Date: 12/02/2018 CLINICAL DATA:  Shortness of breath EXAM: PORTABLE CHEST 1 VIEW COMPARISON:  11/30/2018 FINDINGS: Cardiac shadow is stable. Aortic calcifications are noted. Bilateral pleural effusions are noted right greater than left. No definitive focal infiltrate is seen. No bony abnormality is noted. IMPRESSION: Bilateral pleural effusions right greater than left. Electronically Signed   By: Alcide Clever M.D.   On: 12/02/2018 08:39   Dg Ercp Biliary & Pancreatic Ducts  Result Date: 12/02/2018 CLINICAL DATA:  Choledocholithiasis. EXAM: ERCP TECHNIQUE: Multiple spot images obtained with the fluoroscopic device and submitted for interpretation post-procedure. FLUOROSCOPY TIME:  Fluoroscopy Time:  2 minutes  and 12 seconds COMPARISON:  MRI 11/25/2018 FINDINGS: Common bile duct was cannulated and opacified with contrast. Dilatation of the common bile duct. Filling defects in the common hepatic duct and central intrahepatic bile ducts are compatible with stones. Evidence for a balloon sweep. Placement of a nonmetallic biliary stent. IMPRESSION: Dilated biliary system with choledocholithiasis. Placement of biliary stent. These images were submitted for radiologic interpretation only. Please see the procedural report for the amount of contrast and the fluoroscopy time utilized. Electronically Signed   By: Richarda Overlie M.D.   On: 12/02/2018 16:39   Dg C-arm 1-60 Min-no Report  Result Date: 11/26/2018 Fluoroscopy was utilized by the requesting physician.  No radiographic interpretation.     Subjective: Pt says she is feeling better, she is tolerating diet and she really wants to go home.   Edema is much better.   Discharge Exam: Vitals:   12/03/18 0558 12/03/18 0816  BP: (!) 133/54   Pulse: 69   Resp: 17   Temp: 98.5 F (36.9 C)   SpO2: 93% 91%   Vitals:   12/02/18 2131 12/03/18 0537 12/03/18 0558 12/03/18 0816  BP: 119/66  (!) 133/54   Pulse: 91  69   Resp: 16  17   Temp: 99.8 F (37.7 C)  98.5 F (36.9 C)   TempSrc: Oral  Oral   SpO2: 96%  93% 91%  Weight:  65.9 kg    Height:       General: Pt is alert, awake, not in acute distress Cardiovascular: normal S1/S2 +, no rubs, no gallops Respiratory: CTA bilaterally, no wheezing, no rhonchi Abdominal: Soft, NT, ND, bowel sounds + Extremities: trace pretibial edema, no cyanosis   The results of significant diagnostics from this hospitalization (including imaging, microbiology, ancillary and laboratory) are listed below for reference.     Microbiology: Recent Results (from the past 240 hour(s))  SARS Coronavirus 2 (CEPHEID - Performed in [facility] hospital lab), Hosp Order     Status: None   Collection Time: 11/25/18  7:26 PM  Result  Value Ref Range Status   SARS Coronavirus 2 NEGATIVE NEGATIVE Final    Comment: (NOTE) If result is NEGATIVE SARS-CoV-2 target nucleic acids are NOT DETECTED. The SARS-CoV-2 RNA is generally detectable in upper and lower  respiratory specimens during the acute phase of infection. The lowest  concentration of SARS-CoV-2 viral copies this assay can detect is 250  copies / mL. A negative result does not preclude SARS-CoV-2 infection  and should not be used as the sole basis for treatment or other  patient management decisions.  A negative result may occur with  improper specimen collection / handling, submission of specimen other  than nasopharyngeal swab, presence of viral mutation(s) within the  areas targeted by this assay, and inadequate number of viral copies  (<250 copies / mL). A negative result must be combined with clinical  observations, patient history, and epidemiological information. If result is POSITIVE SARS-CoV-2 target nucleic acids are DETECTED. The SARS-CoV-2 RNA is generally detectable in upper and lower  respiratory specimens dur ing the acute phase of infection.  Positive  results are indicative of active infection with SARS-CoV-2.  Clinical  correlation with patient history and other diagnostic information is  necessary to determine patient infection status.  Positive results do  not rule out bacterial infection or co-infection with other viruses. If result is PRESUMPTIVE POSTIVE SARS-CoV-2 nucleic acids MAY BE PRESENT.   A presumptive positive result was  obtained on the submitted specimen  and confirmed on repeat testing.  While 2019 novel coronavirus  (SARS-CoV-2) nucleic acids may be present in the submitted sample  additional confirmatory testing may be necessary for epidemiological  and / or clinical management purposes  to differentiate between  SARS-CoV-2 and other Sarbecovirus currently known to infect humans.  If clinically indicated additional testing  with an alternate test  methodology [phone]) is advised. The SARS-CoV-2 RNA is generally  detectable in upper and lower respiratory sp ecimens during the acute  phase of infection. The expected result is Negative. Fact Sheet for Patients:  BoilerBrush.com.cy Fact Sheet for Healthcare Providers: https://pope.com/ This test is not yet approved or cleared by the [place] FDA and has been authorized for detection and/or diagnosis of SARS-CoV-2 by FDA under an Emergency Use Authorization (EUA).  This EUA will remain in effect (meaning this test can be used) for the duration of the COVID-19 declaration under Section 564(b)(1) of the Act, 21 U.S.C. section 360bbb-3(b)(1), unless the authorization is terminated or revoked sooner. Performed at [facility], [address] [place], [place] [address]   Surgical PCR screen     Status: None   Collection Time: 11/27/18 12:50 AM  Result Value Ref Range Status   MRSA, PCR NEGATIVE NEGATIVE Final   Staphylococcus aureus NEGATIVE NEGATIVE Final    Comment: (NOTE) The Xpert SA Assay (FDA approved for NASAL specimens in patients 56 years of age and older), is one component of a comprehensive surveillance program. It is not intended to diagnose infection nor to guide or monitor treatment. Performed at [facility], [address] [place], [place] [address]   SARS Coronavirus 2 (CEPHEID- Performed in [facility] hospital lab), Hosp Order     Status: None   Collection Time: 11/30/18  6:24 PM  Result Value Ref Range Status   SARS Coronavirus 2 NEGATIVE NEGATIVE Final    Comment: (NOTE) If result is NEGATIVE SARS-CoV-2 target nucleic acids are NOT DETECTED. The SARS-CoV-2 RNA is generally detectable in upper and lower  respiratory specimens during the acute phase of infection. The lowest  concentration of SARS-CoV-2 viral copies this assay can detect is 250  copies / mL. A negative result does not  preclude SARS-CoV-2 infection  and should not be used as the sole basis for treatment or other  patient management decisions.  A negative result may occur with  improper specimen collection / handling, submission of specimen other  than nasopharyngeal swab, presence of viral mutation(s) within the  areas targeted by this assay, and inadequate number of viral copies  (<250 copies / mL). A negative result must be combined with clinical  observations, patient history, and epidemiological information. If result is POSITIVE SARS-CoV-2 target nucleic acids are DETECTED. The SARS-CoV-2 RNA is generally detectable in upper and lower  respiratory specimens dur ing the acute phase of infection.  Positive  results are indicative of active infection with SARS-CoV-2.  Clinical  correlation with patient history and other diagnostic information is  necessary to determine patient infection status.  Positive results do  not rule out bacterial infection or co-infection with other viruses. If result is PRESUMPTIVE POSTIVE SARS-CoV-2 nucleic acids MAY BE PRESENT.   A presumptive positive result was obtained on the submitted specimen  and confirmed on repeat testing.  While 2019 novel coronavirus  (SARS-CoV-2) nucleic acids may be present in the submitted sample  additional confirmatory testing may be necessary for epidemiological  and / or clinical management purposes  to differentiate between  SARS-CoV-2 and other Sarbecovirus currently known to infect humans.  If clinically indicated additional testing with an alternate test  methodology [phone]) is advised. The SARS-CoV-2 RNA is generally  detectable in upper and lower respiratory sp ecimens during the acute  phase of infection. The expected result is Negative. Fact Sheet for Patients:  BoilerBrush.com.cyhttps://www.fda.gov/media/136312/download Fact Sheet for Healthcare Providers: https://pope.com/https://www.fda.gov/media/136313/download This test is not yet approved or cleared by  the [place] FDA and has been authorized for detection and/or diagnosis of SARS-CoV-2 by FDA under an Emergency Use Authorization (EUA).  This EUA will remain in effect (meaning this test can be used) for the duration of the COVID-19 declaration under Section 564(b)(1) of the Act, 21 U.S.C. section 360bbb-3(b)(1), unless the authorization is terminated or revoked sooner. Performed at [facility], [address] [place], [place] [address]      Labs: BNP (last 3 results) Recent Labs    11/30/18 2033  BNP 228.0*   Basic Metabolic Panel: Recent Labs  Lab 11/27/18 0409 11/30/18 1625 12/01/18 0402 12/02/18 0233 12/03/18 0506  NA 138 139 137 136 136  K 4.4 3.7 3.8 3.4* 3.9  CL 108 102 105 98 99  CO2 23 23 25 28 26   GLUCOSE 86 127* 95 103* 85  BUN 17 8 7* 8 11  CREATININE 0.90 0.72 0.58 0.72 0.77  CALCIUM 7.9* 8.9 8.1* 8.6* 9.0  MG 1.9  --   --  1.9 2.1  PHOS 1.6*  --   --  3.9 3.9   Liver Function Tests: Recent Labs  Lab 11/27/18 0409 11/30/18 1625 12/01/18 0402 12/02/18 0233 12/03/18 0506  AST 47* 136* 67* 37  37 26  ALT 26 116* 75* 61*  61* 39  ALKPHOS 150* 425* 299* 279*  272* 251*  BILITOT 1.2 0.8 0.8 0.9  1.1 1.3*  PROT 5.1* 6.5 5.3* 6.2*  6.2* 6.5  ALBUMIN 2.3* 2.7* 2.2* 2.6*  2.6* 2.7*   Recent Labs  Lab 11/27/18 0409 12/01/18 0806 12/03/18 0506  AMYLASE 138* 63 92   No results for input(s): AMMONIA in the last 168 hours. CBC: Recent Labs  Lab 11/27/18 0409 11/30/18 1625 12/01/18 0402 12/02/18 0233 12/03/18 0506  WBC 13.4* 8.8 9.5 10.6* 10.5  NEUTROABS 10.8* 4.9  --  5.6 5.8  HGB 11.3* 12.4 10.1* 11.6* 12.3  HCT 37.3 38.9 30.9* 36.0 39.1  MCV 89.2 85.1 83.5 84.5 85.4  PLT 305 389 347 418* 493*   Cardiac Enzymes: Recent Labs  Lab 11/30/18 1625 11/30/18 2033 12/01/18 0402 12/01/18 0801  CKTOTAL  --  156  --   --   CKMB  --  1.9  --   --   TROPONINI 0.05* 0.05* 0.04* 0.04*   BNP: Invalid input(s): POCBNP CBG: No results  for input(s): GLUCAP in the last 168 hours. D-Dimer Recent Labs    11/30/18 1625  DDIMER 4.79*   Hgb A1c Recent Labs    12/01/18 0402  HGBA1C 5.9*   Lipid Profile No results for input(s): CHOL, HDL, LDLCALC, TRIG, CHOLHDL, LDLDIRECT in the last 72 hours. Thyroid function studies No results for input(s): TSH, T4TOTAL, T3FREE, THYROIDAB in the last 72 hours.  Invalid input(s): FREET3 Anemia work up Recent Labs    12/02/18 0233  VITAMINB12 55*  FOLATE 15.3  FERRITIN 96  TIBC 313  IRON 27*  RETICCTPCT 2.7   Urinalysis No results found for: COLORURINE, APPEARANCEUR, LABSPEC, PHURINE, GLUCOSEU, HGBUR, BILIRUBINUR, KETONESUR, PROTEINUR, UROBILINOGEN, NITRITE, LEUKOCYTESUR  Sepsis Labs Invalid input(s): PROCALCITONIN,  WBC,  LACTICIDVEN Microbiology Recent Results (from the past 240 hour(s))  SARS Coronavirus 2 (CEPHEID - Performed in [facility] hospital lab), Hosp Order     Status: None   Collection Time: 11/25/18  7:26 PM  Result Value Ref Range Status   SARS Coronavirus 2 NEGATIVE NEGATIVE Final    Comment: (NOTE) If result is NEGATIVE SARS-CoV-2 target nucleic acids are NOT DETECTED. The SARS-CoV-2 RNA is generally detectable in upper and lower  respiratory specimens during the acute phase of infection. The lowest  concentration of SARS-CoV-2 viral copies this assay can detect is 250  copies / mL. A negative result does not preclude SARS-CoV-2 infection  and should not be used as the sole basis for treatment or other  patient management decisions.  A negative result may occur with  improper specimen collection / handling, submission of specimen other  than nasopharyngeal swab, presence of viral mutation(s) within the  areas targeted by this assay, and inadequate number of viral copies  (<250 copies / mL). A negative result must be combined with clinical  observations, patient history, and epidemiological information. If result is POSITIVE SARS-CoV-2 target nucleic  acids are DETECTED. The SARS-CoV-2 RNA is generally detectable in upper and lower  respiratory specimens dur ing the acute phase of infection.  Positive  results are indicative of active infection with SARS-CoV-2.  Clinical  correlation with patient history and other diagnostic information is  necessary to determine patient infection status.  Positive results do  not rule out bacterial infection or co-infection with other viruses. If result is PRESUMPTIVE POSTIVE SARS-CoV-2 nucleic acids MAY BE PRESENT.   A presumptive positive result was obtained on the submitted specimen  and confirmed on repeat testing.  While 2019 novel coronavirus  (SARS-CoV-2) nucleic acids may be present in the submitted sample  additional confirmatory testing may be necessary for epidemiological  and / or clinical management purposes  to differentiate between  SARS-CoV-2 and other Sarbecovirus currently known to infect humans.  If clinically indicated additional testing with an alternate test  methodology [phone]) is advised. The SARS-CoV-2 RNA is generally  detectable in upper and lower respiratory sp ecimens during the acute  phase of infection. The expected result is Negative. Fact Sheet for Patients:  BoilerBrush.com.cy Fact Sheet for Healthcare Providers: https://pope.com/ This test is not yet approved or cleared by the [place] FDA and has been authorized for detection and/or diagnosis of SARS-CoV-2 by FDA under an Emergency Use Authorization (EUA).  This EUA will remain in effect (meaning this test can be used) for the duration of the COVID-19 declaration under Section 564(b)(1) of the Act, 21 U.S.C. section 360bbb-3(b)(1), unless the authorization is terminated or revoked sooner. Performed at [facility], [address], [place], [place] [address]   Surgical PCR screen     Status: None   Collection Time: 11/27/18 12:50 AM  Result Value Ref Range  Status   MRSA, PCR NEGATIVE NEGATIVE Final   Staphylococcus aureus NEGATIVE NEGATIVE Final    Comment: (NOTE) The Xpert SA Assay (FDA approved for NASAL specimens in patients 72 years of age and older), is one component of a comprehensive surveillance program. It is not intended to diagnose infection nor to guide or monitor treatment. Performed at [facility], [address] [place], [place] [address]   SARS Coronavirus 2 (CEPHEID- Performed in [facility] hospital lab), Hosp Order     Status: None   Collection Time: 11/30/18  6:24 PM  Result Value Ref Range Status   SARS Coronavirus 2 NEGATIVE NEGATIVE Final    Comment: (NOTE) If result is NEGATIVE SARS-CoV-2 target nucleic acids are NOT DETECTED. The SARS-CoV-2 RNA is generally detectable in upper and lower  respiratory specimens during the acute phase of infection. The lowest  concentration of SARS-CoV-2 viral copies this assay can detect is 250  copies / mL. A negative result does not preclude SARS-CoV-2 infection  and should not be used as the sole basis for treatment or other  patient management decisions.  A negative result may occur with  improper specimen collection / handling, submission of specimen other  than nasopharyngeal swab, presence of viral mutation(s) within the  areas targeted by this assay, and inadequate number of viral copies  (<250 copies / mL). A negative result must be combined with clinical  observations, patient history, and epidemiological information. If result is POSITIVE SARS-CoV-2 target nucleic acids are DETECTED. The SARS-CoV-2 RNA is generally detectable in upper and lower  respiratory specimens dur ing the acute phase of infection.  Positive  results are indicative of active infection with SARS-CoV-2.  Clinical  correlation with patient history and other diagnostic information is  necessary to determine patient infection status.  Positive results do  not rule out bacterial infection or  co-infection with other viruses. If result is PRESUMPTIVE POSTIVE SARS-CoV-2 nucleic acids MAY BE PRESENT.   A presumptive positive result was obtained on the submitted specimen  and confirmed on repeat testing.  While 2019 novel coronavirus  (SARS-CoV-2) nucleic acids may be present in the submitted sample  additional confirmatory testing may be necessary for epidemiological  and / or clinical management purposes  to differentiate between  SARS-CoV-2 and other Sarbecovirus currently known to infect humans.  If clinically indicated additional testing with an alternate test  methodology [phone]) is advised. The SARS-CoV-2 RNA is generally  detectable in upper and lower respiratory sp ecimens during the acute  phase of infection. The expected result is Negative. Fact Sheet for Patients:  BoilerBrush.com.cy Fact Sheet for Healthcare Providers: https://pope.com/ This test is not yet approved or cleared by the [place] FDA and has been authorized for detection and/or diagnosis of SARS-CoV-2 by FDA under an Emergency Use Authorization (EUA).  This EUA will remain in effect (meaning this test can be used) for the duration of the COVID-19 declaration under Section 564(b)(1) of the Act, 21 U.S.C. section 360bbb-3(b)(1), unless the authorization is terminated or revoked sooner. Performed at [facility], [address] [place], [place] [address]    Time coordinating discharge: 34 minutes   SIGNED:  Standley Dakins, MD  [facility] 12/03/2018, 11:30 AM How to contact the [facility] Attending or Consulting provider 7A - 7P or covering provider during after hours 7P -7A, for this patient?  1. Check the care team in [facility] and look for a) attending/consulting [facility] provider listed and b) the [facility] team listed 2. Log into [email or web address] and use [place]'s universal password to access. If you do not have the password, please contact the hospital  operator. 3. Locate the [facility] provider you are looking for under [facility] and page to a number that you can be directly reached. 4. If you still have difficulty reaching the provider, please page the [facility] (Director on Call) for the Hospitalists listed on amion for assistance.

## 2018-12-03 NOTE — Progress Notes (Addendum)
Progress Note  Patient Name: Sherry Bridges Date of Encounter: 12/03/2018  Primary Cardiologist: Rozann Lesches, MD   Subjective   Breathing back to baseline. Denies any symptoms. Anxious to go home.   Inpatient Medications    Scheduled Meds: . docusate sodium  100 mg Oral BID  . furosemide  20 mg Oral Daily  . guaiFENesin  1,200 mg Oral BID  . iron polysaccharides  150 mg Oral Daily  . levothyroxine  88 mcg Oral Q0600  . nicotine  21 mg Transdermal Daily  . sodium chloride flush  3 mL Intravenous Q12H  . umeclidinium-vilanterol  1 puff Inhalation Daily  . vitamin B-12  1,000 mcg Oral Daily   Continuous Infusions: . sodium chloride     PRN Meds: sodium chloride, acetaminophen **OR** acetaminophen, albuterol, hydrALAZINE, ondansetron (ZOFRAN) IV, oxyCODONE, sodium chloride flush, zolpidem   Vital Signs    Vitals:   12/02/18 2131 12/03/18 0537 12/03/18 0558 12/03/18 0816  BP: 119/66  (!) 133/54   Pulse: 91  69   Resp: 16  17   Temp: 99.8 F (37.7 C)  98.5 F (36.9 C)   TempSrc: Oral  Oral   SpO2: 96%  93% 91%  Weight:  65.9 kg    Height:        Intake/Output Summary (Last 24 hours) at 12/03/2018 0819 Last data filed at 12/03/2018 0741 Gross per 24 hour  Intake 2320 ml  Output 2025 ml  Net 295 ml    Last 3 Weights 12/03/2018 12/02/2018 11/30/2018  Weight (lbs) 145 lb 4.5 oz 146 lb 6.2 oz 158 lb 1.1 oz  Weight (kg) 65.9 kg 66.4 kg 71.7 kg      Telemetry    NSR, HR in 60's to 70's with no significant arrhythmias.  - Personally Reviewed  ECG    No new tracings.   Physical Exam   General: Well developed, well nourished, female appearing in no acute distress. Head: Normocephalic, atraumatic.  Neck: Supple without bruits, JVD not elevated. Lungs:  Resp regular and unlabored, slightly decreased along bases bilaterally. Heart: RRR, S1, S2, no S3, S4, or murmur; no rub. Abdomen: Soft, non-tender, non-distended with normoactive bowel sounds. No  hepatomegaly. No rebound/guarding. No obvious abdominal masses. Extremities: No clubbing, cyanosis, or lower extremity edema. Distal pedal pulses are 2+ bilaterally. Neuro: Alert and oriented X 3. Moves all extremities spontaneously. Psych: Normal affect.  Labs    Chemistry Recent Labs  Lab 12/01/18 0402 12/02/18 0233 12/03/18 0506  NA 137 136 136  K 3.8 3.4* 3.9  CL 105 98 99  CO2 25 28 26   GLUCOSE 95 103* 85  BUN 7* 8 11  CREATININE 0.58 0.72 0.77  CALCIUM 8.1* 8.6* 9.0  PROT 5.3* 6.2*  6.2* 6.5  ALBUMIN 2.2* 2.6*  2.6* 2.7*  AST 67* 37  37 26  ALT 75* 61*  61* 39  ALKPHOS 299* 279*  272* 251*  BILITOT 0.8 0.9  1.1 1.3*  GFRNONAA >60 >60 >60  GFRAA >60 >60 >60  ANIONGAP 7 10 11      Hematology Recent Labs  Lab 12/01/18 0402 12/02/18 0233 12/03/18 0506  WBC 9.5 10.6* 10.5  RBC 3.70* 4.26  4.26 4.58  HGB 10.1* 11.6* 12.3  HCT 30.9* 36.0 39.1  MCV 83.5 84.5 85.4  MCH 27.3 27.2 26.9  MCHC 32.7 32.2 31.5  RDW 14.4 14.2 14.6  PLT 347 418* 493*    Cardiac Enzymes Recent Labs  Lab 11/30/18 1625 11/30/18  2033 12/01/18 0402 12/01/18 0801  TROPONINI 0.05* 0.05* 0.04* 0.04*   No results for input(s): TROPIPOC in the last 168 hours.   BNP Recent Labs  Lab 11/30/18 2033  BNP 228.0*     DDimer  Recent Labs  Lab 11/30/18 1625  DDIMER 4.79*     Radiology    Dg Chest Port 1 View  Result Date: 12/02/2018 CLINICAL DATA:  Shortness of breath EXAM: PORTABLE CHEST 1 VIEW COMPARISON:  11/30/2018 FINDINGS: Cardiac shadow is stable. Aortic calcifications are noted. Bilateral pleural effusions are noted right greater than left. No definitive focal infiltrate is seen. No bony abnormality is noted. IMPRESSION: Bilateral pleural effusions right greater than left. Electronically Signed   By: Alcide CleverMark  Lukens M.D.   On: 12/02/2018 08:39   Dg Ercp Biliary & Pancreatic Ducts  Result Date: 12/02/2018 CLINICAL DATA:  Choledocholithiasis. EXAM: ERCP TECHNIQUE: Multiple  spot images obtained with the fluoroscopic device and submitted for interpretation post-procedure. FLUOROSCOPY TIME:  Fluoroscopy Time:  2 minutes and 12 seconds COMPARISON:  MRI 11/25/2018 FINDINGS: Common bile duct was cannulated and opacified with contrast. Dilatation of the common bile duct. Filling defects in the common hepatic duct and central intrahepatic bile ducts are compatible with stones. Evidence for a balloon sweep. Placement of a nonmetallic biliary stent. IMPRESSION: Dilated biliary system with choledocholithiasis. Placement of biliary stent. These images were submitted for radiologic interpretation only. Please see the procedural report for the amount of contrast and the fluoroscopy time utilized. Electronically Signed   By: Richarda OverlieAdam  Henn M.D.   On: 12/02/2018 16:39    Cardiac Studies   Echocardiogram: 12/01/2018 IMPRESSIONS    1. The left ventricle has normal systolic function with an ejection fraction of 60-65%. The cavity size was normal. Left ventricular diastolic Doppler parameters are consistent with impaired relaxation. No evidence of left ventricular regional wall  motion abnormalities.  2. The right ventricle has normal systolic function. The cavity was normal. There is no increase in right ventricular wall thickness.  3. The aortic valve is tricuspid. Aortic valve regurgitation is mild by color flow Doppler. Mild aortic annular calcification noted.  4. The mitral valve is grossly normal.  5. The tricuspid valve is grossly normal. There is mild tricuspid regurgitation.  6. The aortic root is normal in size and structure.  Patient Profile     78 y.o. female with past medical history of HLD, COPD, and hypothyroidism who is currently admitted for choledocholithiasis. Cardiology consulted in regards to CHF exacerbation and coronary calcifications by CT Imaging.   Assessment & Plan    1. Acute Diastolic CHF Exacerbation - presented with worsening dyspnea on exertion and  edema with BNP elevated to 228 on admission and CXR showing bilateral pleural effusions.  - I&O's have not been recorded fully but weight has declined by 4 lbs. Weight 145 lbs this AM but reports her prior baseline weight of 149-150 lbs prior to admission. Suspect she could be discharged later today from a Cardiology perspective. Would recommend taking PO Lasix 20mg  for 4-5 days following discharge then only on a PRN basis. Reviewed importance of obtaining daily weights with the patient.   2. Coronary Calcifications/ Elevated Troponin - noted to have coronary calcifications on CTA this admission. Echo shows a preserved EF of 60-65% with no regional WMA. Initial and cyclic troponin values flat at 0.05, 0.05, and 0.04. - No plans for further inpatient cardiac evaluation. Would consider outpatient stress testing once recovered from her recent surgery and procedures.  3. Aortic Regurgitation - mild by echocardiogram this admission.   4. HLD - followed by PCP. Continue PTA Atorvastatin 40mg  daily and Fish Oil.  5. Choledocholithiasis - s/p laparoscopic cholecystectomy on 11/27/2018 and now s/p ERCP on 12/02/2018 which showed the pancreatic stent had spontaneously passed. Had plastic stent left in place with plans for repeat outpatient ERCP in 8 weeks.  - being followed by GI and General Surgery.   For questions or updates, please contact [facility] Please consult [email or web address] for contact info under Cardiology/STEMI.   Lorri FrederickSigned, Brittany M Strader , PA-C 8:19 AM 12/03/2018 Pager: [phone]   Attending note:  Patient seen and examined.  Case reviewed with Ms. Iran OuchStrader PA-C.  I agree with her above recommendations.  Ms. Edelstein plans on discharge home today, generally feels better.  She has diuresed well, does have small residual pleural effusions but will continue on Lasix as an outpatient and we will arrange follow-up from there.  As noted previously, she has coronary artery  calcifications incidentally noted by CT imaging.  We will ultimately plan a follow-up Myoview study once she is more stable as an outpatient.  [facility] will sign off.   Medication Recommendations: Lasix 20 mg daily.  Can likely resume baby aspirin and Lipitor as an outpatient with documented atherosclerosis, LFTs trended to normal. Other recommendations (labs, testing, etc): We will consider follow-up Myoview as an outpatient. Follow up as an outpatient: Office follow-up in [place] arranged.  Jonelle SidleSamuel G. Sophronia Varney, M.D., F.A.C.C.

## 2018-12-03 NOTE — Care Management Important Message (Signed)
Important Message  Patient Details  Name: Sherry Bridges MRN: [id number] Date of Birth: [birth year]   Medicare Important Message Given:  Yes    Tommy Medal 12/03/2018, 11:09 AM

## 2018-12-03 NOTE — Progress Notes (Signed)
[facility]  No complaints. Ate breakfast. Feeling better. Dr. Laural Golden having her follow up in 1 month. I plan for Telephone Call Post op unless she has issues.  6/18 is a telephone call.   Future Appointments  Date Time Provider [place]  12/11/2018  1:15 PM Virl Cagey, MD RS-RS None  12/19/2018  3:00 PM Ahmed Prima, Fransisco Hertz, [place], MD [facility] [address] [place], [place] [address] [phone] (office)

## 2018-12-03 NOTE — Progress Notes (Signed)
  Subjective:  Patient has no complaints.  She denies chest pain shortness of breath or abdominal pain.  She is hungry.  Her diet was changed early this morning but she still waiting for heart healthy meal.  Objective: Blood pressure (!) 133/54, pulse 69, temperature 98.5 F (36.9 C), temperature source Oral, resp. rate 17, height '5\' 4"'$  (1.626 m), weight 65.9 kg, SpO2 91 %. Patient is alert and in no acute distress. Abdomen is soft and nontender with no organomegaly or masses.  Labs/studies Results:  CBC Latest Ref Rng & Units 12/03/2018 12/02/2018 12/01/2018  WBC 4.0 - 10.5 K/uL 10.5 10.6(H) 9.5  Hemoglobin 12.0 - 15.0 g/dL 12.3 11.6(L) 10.1(L)  Hematocrit 36.0 - 46.0 % 39.1 36.0 30.9(L)  Platelets 150 - 400 K/uL 493(H) 418(H) 347    CMP Latest Ref Rng & Units 12/03/2018 12/02/2018 12/02/2018  Glucose 70 - 99 mg/dL 85 - 103(H)  BUN 8 - 23 mg/dL 11 - 8  Creatinine 0.44 - 1.00 mg/dL 0.77 - 0.72  Sodium 135 - 145 mmol/L 136 - 136  Potassium 3.5 - 5.1 mmol/L 3.9 - 3.4(L)  Chloride 98 - 111 mmol/L 99 - 98  CO2 22 - 32 mmol/L 26 - 28  Calcium 8.9 - 10.3 mg/dL 9.0 - 8.6(L)  Total Protein 6.5 - 8.1 g/dL 6.5 6.2(L) 6.2(L)  Total Bilirubin 0.3 - 1.2 mg/dL 1.3(H) 0.9 1.1  Alkaline Phos 38 - 126 U/L 251(H) 279(H) 272(H)  AST 15 - 41 U/L 26 37 37  ALT 0 - 44 U/L 39 61(H) 61(H)    Hepatic Function Latest Ref Rng & Units 12/03/2018 12/02/2018 12/02/2018  Total Protein 6.5 - 8.1 g/dL 6.5 6.2(L) 6.2(L)  Albumin 3.5 - 5.0 g/dL 2.7(L) 2.6(L) 2.6(L)  AST 15 - 41 U/L 26 37 37  ALT 0 - 44 U/L 39 61(H) 61(H)  Alk Phosphatase 38 - 126 U/L 251(H) 279(H) 272(H)  Total Bilirubin 0.3 - 1.2 mg/dL 1.3(H) 0.9 1.1  Bilirubin, Direct 0.0 - 0.2 mg/dL - 0.2 -      Assessment:  #1.  Choledocholithiasis.  Patient underwent ERCP with biliary sphincterotomy yesterday and stones could not be removed.  Review of images reveals she has 5 stones instead of 3.  Transaminases have normalized and alkaline phosphatase is coming  down.  #2.  B12 deficiency.  Patient received first dose of B12 yesterday.  Hemoglobin has gone up with diureses.  #3.  CHF secondary to diastolic dysfunction.  Patient much better with diuretic therapy being followed by cardiology.  She has mild aortic incompetence.  Recommendations:  Stable for discharge from GI standpoint. Can resume aspirin on 12/05/2018. Patient can resume atorvastatin at the time of discharge. Will arrange for office visit with LFTs in 1 month. We will schedule ERCP with stent and stone removal following that visit.

## 2018-12-03 NOTE — Progress Notes (Signed)
IV removed, patient tolerated well.  Reviewed AVS with patient who verbalized understanding.  Patient transported to short stay via wheelchair, and transported home by her daughter.

## 2018-12-04 ENCOUNTER — Other Ambulatory Visit: Payer: Self-pay | Admitting: *Deleted

## 2018-12-04 DIAGNOSIS — I2510 Atherosclerotic heart disease of native coronary artery without angina pectoris: Secondary | ICD-10-CM | POA: Insufficient documentation

## 2018-12-04 DIAGNOSIS — N924 Excessive bleeding in the premenopausal period: Secondary | ICD-10-CM | POA: Insufficient documentation

## 2018-12-04 NOTE — Patient Outreach (Addendum)
[facility]) Care Management  12/04/2018  Sherry Bridges 10/16/1940 [id number]   EMMI-general discharge from [place] Day # 4 Date: Wednesday 12/03/18 [place] Reason: Read discharge papers? No Know who to call about changes in condition? No Insurance:  Medicare next gen and Engineer, production supplement [facility] admissions x 2  ED visits x 2 in the last 6 months  Last admission 11/30/18 to 12/03/18 admitted at [facility] for acute cholecystitis , and transferred to [facility] for ERCP  Outreach attempt # 1 [name] answered.  He immediately began to discuss with [facility] RN CM that he was the person to answer the EMMI questions 12/03/18 EMMI questions. He reports getting frustrated with the EMMI automated system questions. He admits he may have answered some questions incorrect and concluded the call prior to it being finished   He went to check and found [name] sleeping  [name] was able to verify HIPAA  [facility] RN CM left HIPAA compliant voicemail message along with CM's contact info. [facility] RN CM provided her business hours and encouraged [name] to have [name] return a call prn  [facility] Care Management RN mentioned the red alert with [name] and he confirms they did receive discharge papers  He and [facility] RN CM discussed that it after hospital d/c patients are to follow up with their primary care MD. [name] voiced understanding  [name] was allowed to ventilate his feelings about feeling like he "had no control" during [name] hospitalization. He voiced frustration with now being able to be with her when the doctors were visiting and having to wait in the parking lot at times [facility] RN CM empathized with him and discussed the availability to contact [facility] patient experience   Social: [name] is retired and lives at home with her husband, [name]. She is able to complete her ADLs and he assists her with iADLs and transportation. [name]  confirms they have been married 70 years.   Conditions: Acute cholecystitis with s/p ERCP, Lap chole by Curlene Labrum on 11/27/2018, HTN, HLD, hypothyroidism, dyspnea, abnormal liver function, acute respiratory failure with hypoxia, former smoker, Copd (not on home oxygen) with emphysema, bilateral Pleural effusions, acute diastolic, menopause and CAD,  CHF   Plan: [facility] RN CM sent an unsuccessful outreach letter and scheduled this patient for another call attempt within 4 business days if no return call from her   [place]. Lavina Hamman, RN, BSN, [place] Coordinator Office number [phone] Mobile number [phone]  Main [facility] number [phone] Fax number [phone]

## 2018-12-05 ENCOUNTER — Encounter (HOSPITAL_COMMUNITY): Payer: Self-pay | Admitting: Internal Medicine

## 2018-12-08 ENCOUNTER — Other Ambulatory Visit: Payer: Self-pay

## 2018-12-08 ENCOUNTER — Other Ambulatory Visit: Payer: Self-pay | Admitting: *Deleted

## 2018-12-08 NOTE — Patient Outreach (Signed)
[facility]) Care Management  12/08/2018  Sherry Bridges 1941/06/06 [id number]   EMMI-general discharge from [place] Day # 4 Date: Wednesday 12/03/18 [place] Reason: Read discharge papers? No Know who to call about changes in condition? No Insurance:  Medicare next gen and Engineer, production supplement [facility] admissions x 2  ED visits x 2 in the last 6 months  Last admission 11/30/18 to 12/03/18 admitted at [facility] for acute cholecystitis , and transferred to [facility] for ERCP  Outreach #2 successful Patient is able to verify HIPAA Reviewed and addressed the purpose of the follow up call with the patient  Consent: [facility] RN CM reviewed [facility] services with patient. Patient gave verbal consent for services.  EMMI questions. She confirms she has discharge papers and know who to call if changes in her condition She reports receiving a call earlier today from 'another live nurse who asked me a lot of questions"   [name] politely confirms she has already answered questions today and thanked CM for calling   Mr Sobolewski and [facility] RN CM previously discussed that after hospital d/c patients are to follow up with their primary care MD. Mr Tibbits voiced understanding    Social: [name] is retired and lives at home with her husband, [name]. She is able to complete her ADLs and he assists her with iADLs and transportation. Mr Etcheverry confirms they have been married 3 years.   Conditions: Acute cholecystitis with s/p ERCP, Lap chole by Curlene Labrum on 11/27/2018, HTN, HLD, hypothyroidism, dyspnea, abnormal liver function, acute respiratory failure with hypoxia, former smoker, Copd (not on home oxygen) with emphysema, bilateral Pleural effusions, acute diastolic, menopause and CAD,  CHF   Plan: [facility] RN CM will close case at this time as patient has been assessed and no needs identified/needs resolved.   Pt encouraged to return a call to [facility] RN CM  prn  Zafirah Vanzee L. Lavina Hamman, RN, BSN, [place] Coordinator Office number [phone] Mobile number [phone]  Main [facility] number [phone] Fax number [phone]

## 2018-12-11 ENCOUNTER — Other Ambulatory Visit: Payer: Self-pay

## 2018-12-11 ENCOUNTER — Telehealth (INDEPENDENT_AMBULATORY_CARE_PROVIDER_SITE_OTHER): Payer: Self-pay | Admitting: General Surgery

## 2018-12-11 DIAGNOSIS — K8000 Calculus of gallbladder with acute cholecystitis without obstruction: Secondary | ICD-10-CM

## 2018-12-11 NOTE — Progress Notes (Signed)
[facility]  I am calling the patient for post operative evaluation due to the current COVID 19 pandemic.  The patient had a laparoscopic cholecystectomy on 11/27/2018 for cholecystitis. She had a prior ERCP on 11/26/2018 that was not successful for stent placement due to swelling. The patient reports that they are doing well.  She had a repeat ERCP last week and had a stent placed but the stones still could not be removed.  The are tolerating a diet, having good pain control, and having regular Bms.  Her incisions are healing and the dermabond is peeling. The patient has no concerns.   She is going to see a new PCP Dr. Chauncey Reading in [place], [place] so that her appts are closer to home.  She follows up with Dr. Laural Golden in the upcoming weeks she says.   Future Appointments  Date Time Provider [place]  12/11/2018  1:15 PM Virl Cagey, MD RS-RS None  12/19/2018  3:00 PM Ahmed Prima, Fransisco Hertz, PA-C [facility] [place] [facility]    Pathology: Gallbladder - ACUTE AND CHRONIC CHOLECYSTITIS WITH NECROSIS AND CHOLELITHIASIS   Will see the patient PRN.    Curlene Labrum, MD [facility] [address] [place], [place] [address] [phone] (office)

## 2018-12-15 DIAGNOSIS — K8050 Calculus of bile duct without cholangitis or cholecystitis without obstruction: Secondary | ICD-10-CM | POA: Diagnosis not present

## 2018-12-15 DIAGNOSIS — N390 Urinary tract infection, site not specified: Secondary | ICD-10-CM | POA: Diagnosis not present

## 2018-12-15 DIAGNOSIS — M810 Age-related osteoporosis without current pathological fracture: Secondary | ICD-10-CM | POA: Diagnosis not present

## 2018-12-15 DIAGNOSIS — Z1231 Encounter for screening mammogram for malignant neoplasm of breast: Secondary | ICD-10-CM | POA: Diagnosis not present

## 2018-12-15 DIAGNOSIS — J449 Chronic obstructive pulmonary disease, unspecified: Secondary | ICD-10-CM | POA: Diagnosis not present

## 2018-12-15 DIAGNOSIS — K219 Gastro-esophageal reflux disease without esophagitis: Secondary | ICD-10-CM | POA: Diagnosis not present

## 2018-12-15 DIAGNOSIS — I5030 Unspecified diastolic (congestive) heart failure: Secondary | ICD-10-CM | POA: Diagnosis not present

## 2018-12-15 DIAGNOSIS — D509 Iron deficiency anemia, unspecified: Secondary | ICD-10-CM | POA: Diagnosis not present

## 2018-12-15 DIAGNOSIS — E538 Deficiency of other specified B group vitamins: Secondary | ICD-10-CM | POA: Diagnosis not present

## 2018-12-15 DIAGNOSIS — D649 Anemia, unspecified: Secondary | ICD-10-CM | POA: Diagnosis not present

## 2018-12-19 ENCOUNTER — Ambulatory Visit: Payer: Medicare Other | Admitting: Student

## 2018-12-19 NOTE — Progress Notes (Deleted)
Cardiology Office Note    Date:  12/19/2018   ID:  Sherry Bridges, DOB 06/11/1941, MRN [id number]  PCP:  Toma DeitersHasanaj, Xaje A, MD  Cardiologist: Nona DellSamuel McDowell, MD    No chief complaint on file.   History of Present Illness:    Sherry MaduroGainelle H Sustaita is a 78 y.o. female with past medical history of HLD, COPD, and hypothyroidism who presents to the office today for hospital follow-up.  She had been admitted to [facility] ED earlier in the month and underwent ERCP and required laparoscopic cholecystectomy on 11/27/2018.  She presented back to the ED on 11/30/2018 for evaluation of worsening dyspnea and epigastric pain.  Reported associated orthopnea at the time.  BNP was elevated to 228 and initial and cyclic troponin values were flat at 0.05. Echocardiogram showed a preserved EF of 60 to 65%. Was treated for an acute diastolic CHF exacerbation which was thought to be secondary to administration of IV fluids during her recent admission. Also underwent repeat ERCP and her pancreatic stent had spontaneously passed but she had a plastic stent placed with repeat outpatient ERCP recommended in 8 weeks. Weight at the time of discharge was 145 lbs which was close to her baseline. Was discharged on Lasix 20 mg daily.  She did have coronary calcifications by CT imaging during admission and it was recommended to pursue a Lexiscan Myoview as an outpatient once her GI work-up was complete.  Past Medical History:  Diagnosis Date  . Hyperlipidemia   . Hypothyroidism 01/02/2017    Past Surgical History:  Procedure Laterality Date  . BILIARY STENT PLACEMENT  11/26/2018   Procedure: BILIARY STENT PLACEMENT (PANCREATIC);  Surgeon: Malissa Hippoehman, Najeeb U, MD;  [facility];  Service: Endoscopy;;  . BILIARY STENT PLACEMENT  12/02/2018   Procedure: BILIARY STENT PLACEMENT;  Surgeon: Malissa Hippoehman, Najeeb U, MD;  Location[facility];  Service: Endoscopy;;  . CHOLECYSTECTOMY N/A 11/27/2018   Procedure: LAPAROSCOPIC  CHOLECYSTECTOMY;  Surgeon: Lucretia RoersBridges, Lindsay C, MD;  Location: [facility] ORS;  Service: General;  Laterality: N/A;  . ERCP N/A 11/26/2018   Procedure: UNSUCCESSFUL ENDOSCOPIC RETROGRADE CHOLANGIOPANCREATOGRAPHY (ERCP) BECAUSE OF SMALL ORIFICE AND EDEMA;  Surgeon: Malissa Hippoehman, Najeeb U, MD;  [facility];  Service: Endoscopy;  Laterality: N/A;  . ERCP N/A 12/02/2018   Procedure: ENDOSCOPIC RETROGRADE CHOLANGIOPANCREATOGRAPHY (ERCP);  Surgeon: Malissa Hippoehman, Najeeb U, MD;  [facility];  Service: Endoscopy;  Laterality: N/A;  . REMOVAL OF STONES  12/02/2018   Procedure: BALLOON DILATION;  Surgeon: Malissa Hippoehman, Najeeb U, MD;  [facility];  Service: Endoscopy;;  . SPHINCTEROTOMY  12/02/2018   Procedure: BILIARY SPHINCTEROTOMY;  Surgeon: Malissa Hippoehman, Najeeb U, MD;  [facility];  Service: Endoscopy;;  . TUBAL LIGATION      Current Medications: Outpatient Medications Prior to Visit  Medication Sig Dispense Refill  . aspirin EC 81 MG tablet Take 1 tablet (81 mg total) by mouth daily.    Marland Kitchen. atorvastatin (LIPITOR) 40 MG tablet Take 40 mg by mouth daily at 6 PM.     . co-enzyme Q-10 30 MG capsule Take 30 mg by mouth daily.     Marland Kitchen. docusate sodium (COLACE) 100 MG capsule Take 1 capsule (100 mg total) by mouth 2 (two) times daily. 60 capsule 2  . EUTHYROX 88 MCG tablet Take 88 mcg by mouth daily.    . furosemide (LASIX) 20 MG tablet Take 1 tablet every day for 5 days then take 1 tablet daily  ONLY as needed  for fluid buildup 30 tablet 0  . Multiple Vitamin (MULTIVITAMIN) tablet Take 1 tablet by mouth daily.    . naproxen sodium (ALEVE) 220 MG tablet Take 1 tablet (220 mg total) by mouth daily as needed (FOR PAIN).    . Omega-3 Fatty Acids (FISH OIL) 1000 MG CAPS Take 1 capsule by mouth daily.     . ondansetron (ZOFRAN) 4 MG tablet Take 1 tablet (4 mg total) by mouth daily as needed for nausea or vomiting. 30 tablet 1  . oxyCODONE (OXY IR/ROXICODONE) 5 MG immediate release tablet Take 1 tablet (5 mg  total) by mouth every 4 (four) hours as needed for moderate pain. (Patient taking differently: Take 2.5 mg by mouth at bedtime as needed for moderate pain. Prescribed 5mg  every 4 hours as needed for pain/) 20 tablet 0  . umeclidinium-vilanterol (ANORO ELLIPTA) 62.5-25 MCG/INH AEPB Inhale 1 puff into the lungs daily for 30 days. 30 each 0  . vitamin B-12 1000 MCG tablet Take 1 tablet (1,000 mcg total) by mouth daily. 30 tablet 1   No facility-administered medications prior to visit.      Allergies:   Patient has no known allergies.   Social History   Socioeconomic History  . Marital status: Married    Spouse name: Not on file  . Number of children: Not on file  . Years of education: Not on file  . Highest education level: Not on file  Occupational History  . Not on file  Social Needs  . Financial resource strain: Not on file  . Food insecurity    Worry: Not on file    Inability: Not on file  . Transportation needs    Medical: Not on file    Non-medical: Not on file  Tobacco Use  . Smoking status: Former Games developermoker  . Smokeless tobacco: Never Used  Substance and Sexual Activity  . Alcohol use: Yes    Comment: OCCASIONAL  . Drug use: No  . Sexual activity: Not on file  Lifestyle  . Physical activity    Days per week: Not on file    Minutes per session: Not on file  . Stress: Not on file  Relationships  . Social Musicianconnections    Talks on phone: Not on file    Gets together: Not on file    Attends religious service: Not on file    Active member of club or organization: Not on file    Attends meetings of clubs or organizations: Not on file    Relationship status: Not on file  Other Topics Concern  . Not on file  Social History Narrative  . Not on file     Family History:  The patient's ***family history includes CAD in her brother, father, and mother.   Review of Systems:   Please see the history of present illness.     General:  No chills, fever, night sweats or weight  changes.  Cardiovascular:  No chest pain, dyspnea on exertion, edema, orthopnea, palpitations, paroxysmal nocturnal dyspnea. Dermatological: No rash, lesions/masses Respiratory: No cough, dyspnea Urologic: No hematuria, dysuria Abdominal:   No nausea, vomiting, diarrhea, bright red blood per rectum, melena, or hematemesis Neurologic:  No visual changes, wkns, changes in mental status. All other systems reviewed and are otherwise negative except as noted above.   Physical Exam:    VS:  There were no vitals taken for this visit.   General: Well developed, well nourished,female appearing in no  acute distress. Head: Normocephalic, atraumatic, sclera non-icteric, no xanthomas, nares are without discharge.  Neck: No carotid bruits. JVD not elevated.  Lungs: Respirations regular and unlabored, without wheezes or rales.  Heart: ***Regular rate and rhythm. No S3 or S4.  No murmur, no rubs, or gallops appreciated. Abdomen: Soft, non-tender, non-distended with normoactive bowel sounds. No hepatomegaly. No rebound/guarding. No obvious abdominal masses. Msk:  Strength and tone appear normal for age. No joint deformities or effusions. Extremities: No clubbing or cyanosis. No edema.  Distal pedal pulses are 2+ bilaterally. Neuro: Alert and oriented X 3. Moves all extremities spontaneously. No focal deficits noted. Psych:  Responds to questions appropriately with a normal affect. Skin: No rashes or lesions noted  Wt Readings from Last 3 Encounters:  12/03/18 145 lb 4.5 oz (65.9 kg)  11/25/18 150 lb 12.7 oz (68.4 kg)  01/02/17 143 lb 9.6 oz (65.1 kg)        Studies/Labs Reviewed:   EKG:  EKG is*** ordered today.  The ekg ordered today demonstrates ***  Recent Labs: 11/27/2018: TSH 1.674 11/30/2018: B Natriuretic Peptide 228.0 12/03/2018: ALT 39; BUN 11; Creatinine, Ser 0.77; Hemoglobin 12.3; Magnesium 2.1; Platelets 493; Potassium 3.9; Sodium 136   Lipid Panel No results found for: CHOL, TRIG,  HDL, CHOLHDL, VLDL, LDLCALC, LDLDIRECT  Additional studies/ records that were reviewed today include:   Echocardiogram: 12/01/2018 IMPRESSIONS    1. The left ventricle has normal systolic function with an ejection fraction of 60-65%. The cavity size was normal. Left ventricular diastolic Doppler parameters are consistent with impaired relaxation. No evidence of left ventricular regional wall  motion abnormalities.  2. The right ventricle has normal systolic function. The cavity was normal. There is no increase in right ventricular wall thickness.  3. The aortic valve is tricuspid. Aortic valve regurgitation is mild by color flow Doppler. Mild aortic annular calcification noted.  4. The mitral valve is grossly normal.  5. The tricuspid valve is grossly normal. There is mild tricuspid regurgitation.  6. The aortic root is normal in size and structure.  Assessment:    No diagnosis found.   Plan:   In order of problems listed above:  1. ***    Medication Adjustments/Labs and Tests Ordered: Current medicines are reviewed at length with the patient today.  Concerns regarding medicines are outlined above.  Medication changes, Labs and Tests ordered today are listed in the Patient Instructions below. There are no Patient Instructions on file for this visit.   Signed, Erma Heritage, PA-C  12/19/2018 12:41 PM    [facility] [address] [place], [place] [address] Phone: [phone] Fax: [phone]

## 2018-12-30 ENCOUNTER — Ambulatory Visit (INDEPENDENT_AMBULATORY_CARE_PROVIDER_SITE_OTHER): Payer: Medicare Other | Admitting: Internal Medicine

## 2018-12-30 DIAGNOSIS — I351 Nonrheumatic aortic (valve) insufficiency: Secondary | ICD-10-CM | POA: Diagnosis not present

## 2018-12-30 DIAGNOSIS — I519 Heart disease, unspecified: Secondary | ICD-10-CM | POA: Diagnosis not present

## 2018-12-30 DIAGNOSIS — E785 Hyperlipidemia, unspecified: Secondary | ICD-10-CM | POA: Diagnosis not present

## 2018-12-30 DIAGNOSIS — E039 Hypothyroidism, unspecified: Secondary | ICD-10-CM | POA: Diagnosis not present

## 2018-12-30 DIAGNOSIS — R5383 Other fatigue: Secondary | ICD-10-CM | POA: Diagnosis not present

## 2018-12-30 DIAGNOSIS — F17200 Nicotine dependence, unspecified, uncomplicated: Secondary | ICD-10-CM | POA: Diagnosis not present

## 2019-01-12 ENCOUNTER — Encounter (INDEPENDENT_AMBULATORY_CARE_PROVIDER_SITE_OTHER): Payer: Self-pay | Admitting: Internal Medicine

## 2019-01-12 ENCOUNTER — Ambulatory Visit (INDEPENDENT_AMBULATORY_CARE_PROVIDER_SITE_OTHER): Payer: Medicare Other | Admitting: Internal Medicine

## 2019-01-12 ENCOUNTER — Other Ambulatory Visit: Payer: Self-pay

## 2019-01-12 DIAGNOSIS — K8050 Calculus of bile duct without cholangitis or cholecystitis without obstruction: Secondary | ICD-10-CM | POA: Diagnosis not present

## 2019-01-12 DIAGNOSIS — R109 Unspecified abdominal pain: Secondary | ICD-10-CM

## 2019-01-12 MED ORDER — HYOSCYAMINE SULFATE 0.125 MG SL SUBL: 0.1250 mg | SUBLINGUAL_TABLET | Freq: Four times a day (QID) | SUBLINGUAL | 1 refills | Status: DC | PRN

## 2019-01-12 NOTE — Progress Notes (Signed)
Presenting complaint;  Follow-up for choledocholithiasis.  Database and subjective:  Patient is 78 year old Caucasian female was admitted to [facility] about 6 weeks ago for calculus cholecystitis and she was also found to have choledocholithiasis.  She underwent ERCP on 11/26/2018 and was not successful.  Her ampullary orifice versus tiny and difficult to see.  It was finally located but CBD could not be cannulated.  Pancreatic stent was placed for prophylaxis.  Since she did not have bile duct obstruction based on her LFTs she went on to have laparoscopic cholecystectomy. She returned for ERCP on 12/02/2018.  She had spontaneously passed a pancreatic stent.  This time bile duct was selectively cannulated.  She had multiple small stones they were high up hilar region.  Biliary sphincterotomy was performed.  The stones could not be removed.  Therefore biliary stent was left in place. She is accompanied by her daughter Ms. Ethel Rana who is a PA. Patient states she is having abdominal cramps every day across her upper abdomen.  Cramping may last for few minutes.  It is not associated with nausea vomiting fever or chills.  Her bowels move daily as long as she stays on Colace.  She denies heartburn.  She says she is eating well.  She feels she has lost another 5 pounds.  She denies chest pain or shortness of breath. She is scheduled to undergo cardiac test next week.  Current Medications: Outpatient Encounter Medications as of 01/12/2019  Medication Sig  . aspirin EC 81 MG tablet Take 1 tablet (81 mg total) by mouth daily.  Marland Kitchen atorvastatin (LIPITOR) 40 MG tablet Take 40 mg by mouth daily at 6 PM.   . co-enzyme Q-10 30 MG capsule Take 30 mg by mouth daily.   . Cyanocobalamin (B-12) 1000 MCG/ML KIT Inject 1,000 mcg as directed every 30 (thirty) days.  Marland Kitchen docusate sodium (COLACE) 100 MG capsule Take 1 capsule (100 mg total) by mouth 2 (two) times daily.  . EUTHYROX 88 MCG tablet Take 88 mcg by mouth daily.  .  Multiple Vitamin (MULTIVITAMIN) tablet Take 1 tablet by mouth daily.  . Omega-3 Fatty Acids (FISH OIL) 1000 MG CAPS Take 1 capsule by mouth daily.   . vitamin B-12 1000 MCG tablet Take 1 tablet (1,000 mcg total) by mouth daily.  . Denosumab (PROLIA [place])   . [DISCONTINUED] furosemide (LASIX) 20 MG tablet Take 1 tablet every day for 5 days then take 1 tablet daily ONLY as needed  for fluid buildup (Patient not taking: Reported on 01/12/2019)  . [DISCONTINUED] naproxen sodium (ALEVE) 220 MG tablet Take 1 tablet (220 mg total) by mouth daily as needed (FOR PAIN). (Patient not taking: Reported on 01/12/2019)  . [DISCONTINUED] ondansetron (ZOFRAN) 4 MG tablet Take 1 tablet (4 mg total) by mouth daily as needed for nausea or vomiting. (Patient not taking: Reported on 01/12/2019)  . [DISCONTINUED] oxyCODONE (OXY IR/ROXICODONE) 5 MG immediate release tablet Take 1 tablet (5 mg total) by mouth every 4 (four) hours as needed for moderate pain. (Patient not taking: Reported on 01/12/2019)   No facility-administered encounter medications on file as of 01/12/2019.     Objective: Blood pressure 116/69, pulse 76, temperature 98.3 F (36.8 C), temperature source Oral, resp. rate 18, height '5\' 4"'$  (1.626 m), weight 140 lb 1.6 oz (63.5 kg). Patient is alert and in no acute distress. Conjunctiva is pink. Sclera is nonicteric Oropharyngeal mucosa is normal. No neck masses or thyromegaly noted. Cardiac exam with regular rhythm normal S1  and S2.  She has grade 3/6 systolic ejection murmur best heard at aortic area. Lungs are clear to auscultation. Abdomen is symmetrical. Bowel sounds are normal.  On palpation is soft and nontender with organomegaly or masses. No LE edema or clubbing noted.  Labs/studies Results:  CBC Latest Ref Rng & Units 12/03/2018 12/02/2018 12/01/2018  WBC 4.0 - 10.5 K/uL 10.5 10.6(H) 9.5  Hemoglobin 12.0 - 15.0 g/dL 12.3 11.6(L) 10.1(L)  Hematocrit 36.0 - 46.0 % 39.1 36.0 30.9(L)  Platelets 150 -  400 K/uL 493(H) 418(H) 347    CMP Latest Ref Rng & Units 12/03/2018 12/02/2018 12/02/2018  Glucose 70 - 99 mg/dL 85 - 103(H)  BUN 8 - 23 mg/dL 11 - 8  Creatinine 0.44 - 1.00 mg/dL 0.77 - 0.72  Sodium 135 - 145 mmol/L 136 - 136  Potassium 3.5 - 5.1 mmol/L 3.9 - 3.4(L)  Chloride 98 - 111 mmol/L 99 - 98  CO2 22 - 32 mmol/L 26 - 28  Calcium 8.9 - 10.3 mg/dL 9.0 - 8.6(L)  Total Protein 6.5 - 8.1 g/dL 6.5 6.2(L) 6.2(L)  Total Bilirubin 0.3 - 1.2 mg/dL 1.3(H) 0.9 1.1  Alkaline Phos 38 - 126 U/L 251(H) 279(H) 272(H)  AST 15 - 41 U/L 26 37 37  ALT 0 - 44 U/L 39 61(H) 61(H)    Hepatic Function Latest Ref Rng & Units 12/03/2018 12/02/2018 12/02/2018  Total Protein 6.5 - 8.1 g/dL 6.5 6.2(L) 6.2(L)  Albumin 3.5 - 5.0 g/dL 2.7(L) 2.6(L) 2.6(L)  AST 15 - 41 U/L 26 37 37  ALT 0 - 44 U/L 39 61(H) 61(H)  Alk Phosphatase 38 - 126 U/L 251(H) 279(H) 272(H)  Total Bilirubin 0.3 - 1.2 mg/dL 1.3(H) 0.9 1.1  Bilirubin, Direct 0.0 - 0.2 mg/dL - 0.2 -      Assessment:  #1.  History of choledocholithiasis.  First ERCP was not successful.  At second ERCP bile duct was selectively cannulated sphincterotomy performed but stones could not be removed they were in the region of hilum and left hepatic duct.  Therefore stent was left in place.  She will need to undergo either ERCP to remove the stent and stones.  #2.  Upper abdominal cramping.  She does not have associated symptoms such as nausea vomiting diarrhea fever or chills.  I am not sure if it is related to biliary stent or may be abdominal wall cramping.   Plan:  Hyoscyamine sublingual 1 tablet 4 times daily as needed.  Patient was informed of effect such as dry mouth and constipation. Patient will go to lab for CBC, LFTs and serum lipase. Further recommendations to follow.

## 2019-01-12 NOTE — Patient Instructions (Signed)
Physician will call with results of blood test and further recommendations. 

## 2019-01-13 ENCOUNTER — Ambulatory Visit (INDEPENDENT_AMBULATORY_CARE_PROVIDER_SITE_OTHER): Payer: Medicare Other | Admitting: Internal Medicine

## 2019-01-13 DIAGNOSIS — R109 Unspecified abdominal pain: Secondary | ICD-10-CM | POA: Diagnosis not present

## 2019-01-15 DIAGNOSIS — R109 Unspecified abdominal pain: Secondary | ICD-10-CM | POA: Diagnosis not present

## 2019-01-19 ENCOUNTER — Other Ambulatory Visit (INDEPENDENT_AMBULATORY_CARE_PROVIDER_SITE_OTHER): Payer: Self-pay | Admitting: *Deleted

## 2019-01-19 DIAGNOSIS — K8050 Calculus of bile duct without cholangitis or cholecystitis without obstruction: Secondary | ICD-10-CM

## 2019-01-20 ENCOUNTER — Encounter (INDEPENDENT_AMBULATORY_CARE_PROVIDER_SITE_OTHER): Payer: Self-pay | Admitting: *Deleted

## 2019-01-22 DIAGNOSIS — R5383 Other fatigue: Secondary | ICD-10-CM | POA: Diagnosis not present

## 2019-01-22 DIAGNOSIS — I519 Heart disease, unspecified: Secondary | ICD-10-CM | POA: Diagnosis not present

## 2019-01-22 DIAGNOSIS — I348 Other nonrheumatic mitral valve disorders: Secondary | ICD-10-CM | POA: Diagnosis not present

## 2019-01-26 ENCOUNTER — Other Ambulatory Visit: Payer: Self-pay

## 2019-02-06 NOTE — Patient Instructions (Signed)
Sherry Bridges  02/06/2019     @PREFPERIOPPHARMACY @   Your procedure is scheduled on  02/13/2019.  Report to [facility]   A.M.  Call this number if you have problems the morning of surgery:  [phone]   Remember:  Do not eat or drink after midnight.                         Take these medicines the morning of surgery with A SIP OF WATER  Levothyroxine, prilosec. Use your inhaler before you come.    Do not wear jewelry, make-up or nail polish.  Do not wear lotions, powders, or perfumes. Please wear deodorant and brush your teeth.  Do not shave 48 hours prior to surgery.  Men may shave face and neck.  Do not bring valuables to the hospital.  [facility] is not responsible for any belongings or valuables.  Contacts, dentures or bridgework may not be worn into surgery.  Leave your suitcase in the car.  After surgery it may be brought to your room.  For patients admitted to the hospital, discharge time will be determined by your treatment team.  Patients discharged the day of surgery will not be allowed to drive home.   Name and phone number of your driver:   Family     Special instructions:  None  Please read over the following fact sheets that you were given. Anesthesia Post-op Instructions and Care and Recovery After Surgery       Endoscopic Retrograde Cholangiopancreatogram, Care After This sheet gives you information about how to care for yourself after your procedure. Your health care provider may also give you more specific instructions. If you have problems or questions, contact your health care provider. What can I expect after the procedure? After the procedure, it is common to have:  Soreness in your throat.  Nausea.  Bloating.  Dizziness.  Tiredness (fatigue). Follow these instructions at home:   Take over-the-counter and prescription medicines only as told by your health care provider.  Do not drive for 24 hours if you were given  a medicine to help you relax (sedative) during your procedure. Have someone stay with you for 24 hours after the procedure.  Return to your normal activities as told by your health care provider. Ask your health care provider what activities are safe for you.  Return to eating what you normally do as soon as you feel well enough or as told by your health care provider.  Keep all follow-up visits as told by your health care provider. This is important. Contact a health care provider if:  You have pain in your abdomen that does not get better with medicine.  You develop signs of infection, such as: ? Chills. ? Feeling unwell. Get help right away if:  You have difficulty swallowing.  You have worsening pain in your throat, chest, or abdomen.  You vomit bright red blood or a substance that looks like coffee grounds.  You have bloody or very black stools.  You have a fever.  You have a sudden increase in swelling (bloating) in your abdomen. Summary  After the procedure, it is common to feel tired and to have some discomfort in your throat.  Contact your health care provider if you have signs of infection-such as chills or feeling unwell-or if you have pain that does not improve with medicine.  Get help right away  if you have trouble swallowing, worsening pain, bloody or black vomit, bloody or black stools, a fever, or increased swelling in your abdomen.  Keep all follow-up visits as told by your health care provider. This is important. This information is not intended to replace advice given to you by your health care provider. Make sure you discuss any questions you have with your health care provider. Document Released: 04/01/2013 Document Revised: 05/24/2017 Document Reviewed: 04/30/2016 Elsevier Patient Education  2020 [employer] Monitored Anesthesia Care, Care After These instructions provide you with information about caring for yourself after your procedure. Your health  care provider may also give you more specific instructions. Your treatment has been planned according to current medical practices, but problems sometimes occur. Call your health care provider if you have any problems or questions after your procedure. What can I expect after the procedure? After your procedure, you may:  Feel sleepy for several hours.  Feel clumsy and have poor balance for several hours.  Feel forgetful about what happened after the procedure.  Have poor judgment for several hours.  Feel nauseous or vomit.  Have a sore throat if you had a breathing tube during the procedure. Follow these instructions at home: For at least 24 hours after the procedure:      Have a responsible adult stay with you. It is important to have someone help care for you until you are awake and alert.  Rest as needed.  Do not: ? Participate in activities in which you could fall or become injured. ? Drive. ? Use heavy machinery. ? Drink alcohol. ? Take sleeping pills or medicines that cause drowsiness. ? Make important decisions or sign legal documents. ? Take care of children on your own. Eating and drinking  Follow the diet that is recommended by your health care provider.  If you vomit, drink water, juice, or soup when you can drink without vomiting.  Make sure you have little or no nausea before eating solid foods. General instructions  Take over-the-counter and prescription medicines only as told by your health care provider.  If you have sleep apnea, surgery and certain medicines can increase your risk for breathing problems. Follow instructions from your health care provider about wearing your sleep device: ? Anytime you are sleeping, including during daytime naps. ? While taking prescription pain medicines, sleeping medicines, or medicines that make you drowsy.  If you smoke, do not smoke without supervision.  Keep all follow-up visits as told by your health care  provider. This is important. Contact a health care provider if:  You keep feeling nauseous or you keep vomiting.  You feel light-headed.  You develop a rash.  You have a fever. Get help right away if:  You have trouble breathing. Summary  For several hours after your procedure, you may feel sleepy and have poor judgment.  Have a responsible adult stay with you for at least 24 hours or until you are awake and alert. This information is not intended to replace advice given to you by your health care provider. Make sure you discuss any questions you have with your health care provider. Document Released: 10/02/2015 Document Revised: 09/09/2017 Document Reviewed: 10/02/2015 Elsevier Patient Education  2020 [employer]

## 2019-02-11 ENCOUNTER — Encounter (HOSPITAL_COMMUNITY)
Admission: RE | Admit: 2019-02-11 | Discharge: 2019-02-11 | Disposition: A | Discharge status: Home / Self Care | Payer: Medicare Other | Source: Ambulatory Visit | Attending: Internal Medicine | Admitting: Internal Medicine

## 2019-02-11 ENCOUNTER — Other Ambulatory Visit: Payer: Self-pay

## 2019-02-11 ENCOUNTER — Other Ambulatory Visit (HOSPITAL_COMMUNITY)
Admission: RE | Admit: 2019-02-11 | Discharge: 2019-02-11 | Disposition: A | Discharge status: Home / Self Care | Payer: Medicare Other | Source: Ambulatory Visit | Attending: Internal Medicine | Admitting: Internal Medicine

## 2019-02-11 DIAGNOSIS — Z20828 Contact with and (suspected) exposure to other viral communicable diseases: Secondary | ICD-10-CM | POA: Insufficient documentation

## 2019-02-11 DIAGNOSIS — Z01812 Encounter for preprocedural laboratory examination: Secondary | ICD-10-CM | POA: Diagnosis not present

## 2019-02-11 LAB — SARS CORONAVIRUS 2 (TAT 6-24 HRS): SARS Coronavirus 2: NEGATIVE

## 2019-02-12 ENCOUNTER — Encounter (HOSPITAL_COMMUNITY)
Admission: RE | Admit: 2019-02-12 | Discharge: 2019-02-12 | Disposition: A | Discharge status: Home / Self Care | Payer: Medicare Other | Source: Ambulatory Visit | Attending: Internal Medicine | Admitting: Internal Medicine

## 2019-02-12 ENCOUNTER — Encounter (HOSPITAL_COMMUNITY): Payer: Self-pay

## 2019-02-12 ENCOUNTER — Other Ambulatory Visit: Payer: Self-pay

## 2019-02-12 DIAGNOSIS — Z79890 Hormone replacement therapy (postmenopausal): Secondary | ICD-10-CM | POA: Diagnosis not present

## 2019-02-12 DIAGNOSIS — Z9049 Acquired absence of other specified parts of digestive tract: Secondary | ICD-10-CM | POA: Diagnosis not present

## 2019-02-12 DIAGNOSIS — I10 Essential (primary) hypertension: Secondary | ICD-10-CM | POA: Diagnosis not present

## 2019-02-12 DIAGNOSIS — K8050 Calculus of bile duct without cholangitis or cholecystitis without obstruction: Secondary | ICD-10-CM | POA: Insufficient documentation

## 2019-02-12 DIAGNOSIS — I2510 Atherosclerotic heart disease of native coronary artery without angina pectoris: Secondary | ICD-10-CM | POA: Diagnosis not present

## 2019-02-12 DIAGNOSIS — K838 Other specified diseases of biliary tract: Secondary | ICD-10-CM | POA: Diagnosis not present

## 2019-02-12 DIAGNOSIS — Z79899 Other long term (current) drug therapy: Secondary | ICD-10-CM | POA: Diagnosis not present

## 2019-02-12 DIAGNOSIS — Z01812 Encounter for preprocedural laboratory examination: Secondary | ICD-10-CM | POA: Insufficient documentation

## 2019-02-12 DIAGNOSIS — J449 Chronic obstructive pulmonary disease, unspecified: Secondary | ICD-10-CM | POA: Diagnosis not present

## 2019-02-12 DIAGNOSIS — T85898A Other specified complication of other internal prosthetic devices, implants and grafts, initial encounter: Secondary | ICD-10-CM | POA: Diagnosis not present

## 2019-02-12 DIAGNOSIS — Z87891 Personal history of nicotine dependence: Secondary | ICD-10-CM | POA: Diagnosis not present

## 2019-02-12 DIAGNOSIS — E785 Hyperlipidemia, unspecified: Secondary | ICD-10-CM | POA: Diagnosis not present

## 2019-02-12 DIAGNOSIS — Z7982 Long term (current) use of aspirin: Secondary | ICD-10-CM | POA: Diagnosis not present

## 2019-02-12 DIAGNOSIS — Y831 Surgical operation with implant of artificial internal device as the cause of abnormal reaction of the patient, or of later complication, without mention of misadventure at the time of the procedure: Secondary | ICD-10-CM | POA: Diagnosis not present

## 2019-02-12 DIAGNOSIS — E039 Hypothyroidism, unspecified: Secondary | ICD-10-CM | POA: Diagnosis not present

## 2019-02-12 DIAGNOSIS — Z8719 Personal history of other diseases of the digestive system: Secondary | ICD-10-CM | POA: Diagnosis not present

## 2019-02-12 HISTORY — DX: Chronic obstructive pulmonary disease, unspecified: J44.9

## 2019-02-12 LAB — COMPREHENSIVE METABOLIC PANEL
ALT: 15 U/L (ref 0–44)
AST: 20 U/L (ref 15–41)
Albumin: 3.9 g/dL (ref 3.5–5.0)
Alkaline Phosphatase: 69 U/L (ref 38–126)
Anion gap: 8 (ref 5–15)
BUN: 13 mg/dL (ref 8–23)
CO2: 25 mmol/L (ref 22–32)
Calcium: 9.5 mg/dL (ref 8.9–10.3)
Chloride: 107 mmol/L (ref 98–111)
Creatinine, Ser: 0.72 mg/dL (ref 0.44–1.00)
GFR calc Af Amer: >60 mL/min (ref >60)
GFR calc non Af Amer: >60 mL/min (ref >60)
Glucose, Bld: 101 mg/dL — ABNORMAL HIGH (ref 70–99)
Potassium: 4.7 mmol/L (ref 3.5–5.1)
Sodium: 140 mmol/L (ref 135–145)
Total Bilirubin: 0.5 mg/dL (ref 0.3–1.2)
Total Protein: 7.3 g/dL (ref 6.5–8.1)

## 2019-02-12 LAB — CBC WITH DIFFERENTIAL/PLATELET
Abs Immature Granulocytes: 0.02 10*3/uL (ref 0.00–0.07)
Basophils Absolute: 0.1 10*3/uL (ref 0.0–0.1)
Basophils Relative: 1 %
Eosinophils Absolute: 0.1 10*3/uL (ref 0.0–0.5)
Eosinophils Relative: 1 %
HCT: 43.3 % (ref 36.0–46.0)
Hemoglobin: 13.5 g/dL (ref 12.0–15.0)
Immature Granulocytes: 0 %
Lymphocytes Relative: 39 %
Lymphs Abs: 3.2 10*3/uL (ref 0.7–4.0)
MCH: 26.8 pg (ref 26.0–34.0)
MCHC: 31.2 g/dL (ref 30.0–36.0)
MCV: 85.9 fL (ref 80.0–100.0)
Monocytes Absolute: 0.8 10*3/uL (ref 0.1–1.0)
Monocytes Relative: 10 %
Neutro Abs: 4.1 10*3/uL (ref 1.7–7.7)
Neutrophils Relative %: 49 %
Platelets: 360 10*3/uL (ref 150–400)
RBC: 5.04 MIL/uL (ref 3.87–5.11)
RDW: 13.7 % (ref 11.5–15.5)
WBC: 8.4 10*3/uL (ref 4.0–10.5)
nRBC: 0 % (ref 0.0–0.2)

## 2019-02-12 LAB — LIPASE, BLOOD: Lipase: 37 U/L (ref 11–51)

## 2019-02-13 ENCOUNTER — Encounter (HOSPITAL_COMMUNITY)
Admission: RE | Disposition: A | Discharge status: Home / Self Care | Payer: Self-pay | Source: Home / Self Care | Attending: Internal Medicine

## 2019-02-13 ENCOUNTER — Encounter (HOSPITAL_COMMUNITY): Payer: Self-pay | Admitting: *Deleted

## 2019-02-13 ENCOUNTER — Ambulatory Visit (HOSPITAL_COMMUNITY): Payer: Medicare Other | Admitting: Anesthesiology

## 2019-02-13 ENCOUNTER — Ambulatory Visit (HOSPITAL_COMMUNITY)
Admission: RE | Admit: 2019-02-13 | Discharge: 2019-02-13 | Disposition: A | Discharge status: Home / Self Care | Payer: Medicare Other | Attending: Internal Medicine | Admitting: Internal Medicine

## 2019-02-13 ENCOUNTER — Other Ambulatory Visit: Payer: Self-pay

## 2019-02-13 ENCOUNTER — Ambulatory Visit (HOSPITAL_COMMUNITY): Payer: Medicare Other

## 2019-02-13 DIAGNOSIS — E785 Hyperlipidemia, unspecified: Secondary | ICD-10-CM | POA: Insufficient documentation

## 2019-02-13 DIAGNOSIS — Y831 Surgical operation with implant of artificial internal device as the cause of abnormal reaction of the patient, or of later complication, without mention of misadventure at the time of the procedure: Secondary | ICD-10-CM | POA: Insufficient documentation

## 2019-02-13 DIAGNOSIS — Z87891 Personal history of nicotine dependence: Secondary | ICD-10-CM | POA: Insufficient documentation

## 2019-02-13 DIAGNOSIS — Z79890 Hormone replacement therapy (postmenopausal): Secondary | ICD-10-CM | POA: Insufficient documentation

## 2019-02-13 DIAGNOSIS — T85898A Other specified complication of other internal prosthetic devices, implants and grafts, initial encounter: Secondary | ICD-10-CM | POA: Insufficient documentation

## 2019-02-13 DIAGNOSIS — Z79899 Other long term (current) drug therapy: Secondary | ICD-10-CM | POA: Diagnosis not present

## 2019-02-13 DIAGNOSIS — I2510 Atherosclerotic heart disease of native coronary artery without angina pectoris: Secondary | ICD-10-CM | POA: Diagnosis not present

## 2019-02-13 DIAGNOSIS — Z8719 Personal history of other diseases of the digestive system: Secondary | ICD-10-CM | POA: Diagnosis not present

## 2019-02-13 DIAGNOSIS — Z4659 Encounter for fitting and adjustment of other gastrointestinal appliance and device: Secondary | ICD-10-CM | POA: Diagnosis not present

## 2019-02-13 DIAGNOSIS — I10 Essential (primary) hypertension: Secondary | ICD-10-CM | POA: Diagnosis not present

## 2019-02-13 DIAGNOSIS — Z9049 Acquired absence of other specified parts of digestive tract: Secondary | ICD-10-CM | POA: Insufficient documentation

## 2019-02-13 DIAGNOSIS — Z7982 Long term (current) use of aspirin: Secondary | ICD-10-CM | POA: Insufficient documentation

## 2019-02-13 DIAGNOSIS — K8050 Calculus of bile duct without cholangitis or cholecystitis without obstruction: Secondary | ICD-10-CM | POA: Diagnosis not present

## 2019-02-13 DIAGNOSIS — E039 Hypothyroidism, unspecified: Secondary | ICD-10-CM | POA: Insufficient documentation

## 2019-02-13 DIAGNOSIS — K838 Other specified diseases of biliary tract: Secondary | ICD-10-CM | POA: Insufficient documentation

## 2019-02-13 DIAGNOSIS — J449 Chronic obstructive pulmonary disease, unspecified: Secondary | ICD-10-CM | POA: Diagnosis not present

## 2019-02-13 HISTORY — PX: GASTROINTESTINAL STENT REMOVAL: SHX6384

## 2019-02-13 HISTORY — PX: ERCP: SHX5425

## 2019-02-13 SURGERY — ERCP, WITH INTERVENTION IF INDICATED
Anesthesia: General | Site: Esophagus

## 2019-02-13 MED ORDER — ROCURONIUM BROMIDE 100 MG/10ML IV SOLN
INTRAVENOUS | Status: DC | PRN
  Administered 2019-02-13: 40 mg via INTRAVENOUS

## 2019-02-13 MED ORDER — LIDOCAINE 2% (20 MG/ML) 5 ML SYRINGE
INTRAMUSCULAR | Status: AC
  Filled 2019-02-13: qty 5

## 2019-02-13 MED ORDER — SUCCINYLCHOLINE CHLORIDE 20 MG/ML IJ SOLN
INTRAMUSCULAR | Status: DC | PRN
  Administered 2019-02-13: 100 mg via INTRAVENOUS

## 2019-02-13 MED ORDER — SODIUM CHLORIDE 0.9 % IV SOLN
INTRAVENOUS | Status: AC
  Filled 2019-02-13: qty 50

## 2019-02-13 MED ORDER — DEXAMETHASONE SODIUM PHOSPHATE 10 MG/ML IJ SOLN
INTRAMUSCULAR | Status: AC
  Filled 2019-02-13: qty 3

## 2019-02-13 MED ORDER — CEFAZOLIN SODIUM-DEXTROSE 2-4 GM/100ML-% IV SOLN
2.0000 g | INTRAVENOUS | Status: AC
  Administered 2019-02-13: 2 g via INTRAVENOUS

## 2019-02-13 MED ORDER — PROPOFOL 10 MG/ML IV BOLUS
INTRAVENOUS | Status: DC | PRN
  Administered 2019-02-13: 100 mg via INTRAVENOUS

## 2019-02-13 MED ORDER — ONDANSETRON HCL 4 MG/2ML IJ SOLN
INTRAMUSCULAR | Status: DC | PRN
  Administered 2019-02-13: 4 mg via INTRAVENOUS

## 2019-02-13 MED ORDER — PROMETHAZINE HCL 25 MG/ML IJ SOLN: 6.2500 mg | INTRAMUSCULAR | Status: DC | PRN

## 2019-02-13 MED ORDER — HYDROCODONE-ACETAMINOPHEN 7.5-325 MG PO TABS: 1.0000 | ORAL_TABLET | Freq: Once | ORAL | Status: DC | PRN

## 2019-02-13 MED ORDER — FENTANYL CITRATE (PF) 100 MCG/2ML IJ SOLN
INTRAMUSCULAR | Status: AC
  Filled 2019-02-13: qty 4

## 2019-02-13 MED ORDER — ROCURONIUM BROMIDE 10 MG/ML (PF) SYRINGE
PREFILLED_SYRINGE | INTRAVENOUS | Status: AC
  Filled 2019-02-13: qty 10

## 2019-02-13 MED ORDER — LACTATED RINGERS IV SOLN
INTRAVENOUS | Status: DC
  Administered 2019-02-13: 1000 mL via INTRAVENOUS

## 2019-02-13 MED ORDER — MIDAZOLAM HCL 2 MG/2ML IJ SOLN
INTRAMUSCULAR | Status: AC
  Filled 2019-02-13: qty 2

## 2019-02-13 MED ORDER — HYDROMORPHONE HCL 1 MG/ML IJ SOLN: 0.2500 mg | INTRAMUSCULAR | Status: DC | PRN

## 2019-02-13 MED ORDER — ONDANSETRON HCL 4 MG/2ML IJ SOLN
INTRAMUSCULAR | Status: AC
  Filled 2019-02-13: qty 2

## 2019-02-13 MED ORDER — GLUCAGON HCL RDNA (DIAGNOSTIC) 1 MG IJ SOLR
INTRAMUSCULAR | Status: DC | PRN
  Administered 2019-02-13 (×2): 0.25 mg via INTRAVENOUS

## 2019-02-13 MED ORDER — SODIUM CHLORIDE 0.9 % IV SOLN
INTRAVENOUS | Status: DC | PRN
  Administered 2019-02-13: 20 mL

## 2019-02-13 MED ORDER — FENTANYL CITRATE (PF) 100 MCG/2ML IJ SOLN
INTRAMUSCULAR | Status: DC | PRN
  Administered 2019-02-13: 100 ug via INTRAVENOUS
  Administered 2019-02-13 (×2): 50 ug via INTRAVENOUS

## 2019-02-13 MED ORDER — MIDAZOLAM HCL 5 MG/5ML IJ SOLN
INTRAMUSCULAR | Status: DC | PRN
  Administered 2019-02-13: 0.5 mg via INTRAVENOUS

## 2019-02-13 MED ORDER — CEFAZOLIN SODIUM-DEXTROSE 2-4 GM/100ML-% IV SOLN
INTRAVENOUS | Status: AC
  Filled 2019-02-13: qty 100

## 2019-02-13 MED ORDER — WATER FOR IRRIGATION, STERILE IR SOLN
Status: DC | PRN
  Administered 2019-02-13: 1000 mL via SURGICAL_CAVITY

## 2019-02-13 MED ORDER — SUCCINYLCHOLINE CHLORIDE 200 MG/10ML IV SOSY
PREFILLED_SYRINGE | INTRAVENOUS | Status: AC
  Filled 2019-02-13: qty 10

## 2019-02-13 MED ORDER — CHLORHEXIDINE GLUCONATE CLOTH 2 % EX PADS: 6.0000 | MEDICATED_PAD | Freq: Once | CUTANEOUS | Status: DC

## 2019-02-13 MED ORDER — MIDAZOLAM HCL 2 MG/2ML IJ SOLN: 0.5000 mg | Freq: Once | INTRAMUSCULAR | Status: DC | PRN

## 2019-02-13 MED ORDER — SUGAMMADEX SODIUM 200 MG/2ML IV SOLN
INTRAVENOUS | Status: DC | PRN
  Administered 2019-02-13: 131.6 mg via INTRAVENOUS

## 2019-02-13 MED ORDER — GLUCAGON HCL RDNA (DIAGNOSTIC) 1 MG IJ SOLR
INTRAMUSCULAR | Status: AC
  Filled 2019-02-13: qty 2

## 2019-02-13 NOTE — Op Note (Addendum)
[facility] Patient Name: Sherry RamusGainelle Bridges Procedure Date: 02/13/2019 11:25 AM MRN: [id number] Date of Birth: 09-Dec-1940 Attending MD: Lionel DecemberNajeeb Paschal Blanton , MD CSN: [id number] Age: 78 Admit Type: Outpatient Procedure:                ERCP Indications:              For therapy of bile duct stone(s), Biliary stent                            removal Providers:                Lionel DecemberNajeeb Blanca Thornton, MD Referring MD:             Lorelei PontMark Mahoney, DO and Franky MachoMark Jenkins, MD Medicines:                General Anesthesia Complications:            No immediate complications. Estimated Blood Loss:     Estimated blood loss: none. Procedure:                Pre-Anesthesia Assessment:                           - Prior to the procedure, a History and Physical                            was performed, and patient medications and                            allergies were reviewed. The patient's tolerance of                            previous anesthesia was also reviewed. The risks                            and benefits of the procedure and the sedation                            options and risks were discussed with the patient.                            All questions were answered, and informed consent                            was obtained. Prior Anticoagulants: The patient has                            taken no previous anticoagulant or antiplatelet                            agents except for aspirin. ASA Grade Assessment:                            III - A patient with severe systemic disease. After  reviewing the risks and benefits, the patient was                            deemed in satisfactory condition to undergo the                            procedure.                           After obtaining informed consent, the scope was                            passed under direct vision. Throughout the                            procedure, the patient's blood pressure, pulse, and                           oxygen saturations were monitored continuously. The                            [id number]-[id number] ([id number] scope was introduced through                            the mouth, and used to inject contrast into and                            used to inject contrast into the bile duct. The                            ERCP was accomplished without difficulty. The                            patient tolerated the procedure well. Findings:      A biliary stent was visible on the scout film. The esophagus was       successfully intubated under direct vision. The scope was advanced to a       normal major papilla in the descending duodenum without detailed       examination of the pharynx, larynx and associated structures, and upper       GI tract. The upper GI tract was grossly normal. One stent was removed       from the biliary tree using a snare. The stent was found to be occluded       via the water column test. The bile duct was deeply cannulated with the       Hydratome sphincterotome. Contrast was injected. I personally       interpreted the bile duct images. There was brisk flow of contrast       through the ducts. Image quality was adequate. Contrast extended to the       main bile duct. Contrast extended to the bifurcation. Contrast extended       to the hepatic ducts. The common bile duct, common hepatic duct, left       main hepatic duct and right main hepatic duct were diffusely dilated.  The biliary tree was swept with a basket and balloon. Sludge was swept       from the duct but no stones found. Impression:               - The left main hepatic duct, right main hepatic                            duct, common bile duct and common hepatic duct were                            dilated.                           - One stent was removed from the biliary tree.                           - The biliary tree was swept with basket and                            balloom and  sludge was found.                           Comment: no stones found on today's exam. Therefore                            she must have passed them.                           Prior sphincterotomy adequate. Moderate Sedation:      Per Anesthesia Care Recommendation:           - Discharge patient to home (with escort).                           - Continue present medications.                           - Clear liquid diet today.                           - Resume previous diet for 1 day. Procedure Code(s):        --- Professional ---                           [phone], Endoscopic retrograde                            cholangiopancreatography (ERCP); with removal of                            foreign body(s) or stent(s) from biliary/pancreatic                            duct(s)                           [id number], Endoscopic retrograde  cholangiopancreatography (ERCP); with removal of                            calculi/debris from biliary/pancreatic duct(s) Diagnosis Code(s):        --- Professional ---                           R60.45Z46.59, Encounter for fitting and adjustment of                            other gastrointestinal appliance and device                           K80.50, Calculus of bile duct without cholangitis                            or cholecystitis without obstruction                           K83.8, Other specified diseases of biliary tract CPT copyright 2019 American Medical Association. All rights reserved. The codes documented in this report are preliminary and upon coder review may  be revised to meet current compliance requirements. Lionel DecemberNajeeb Dorse Locy, MD Lionel DecemberNajeeb Jaclynne Baldo, MD 02/13/2019 11:35:46 AM This report has been signed electronically. Number of Addenda: 0

## 2019-02-13 NOTE — H&P (Signed)
Sherry Bridges is an 78 y.o. female.   Chief Complaint: Patient is here for ERCP with stent and stone removal. HPI: Patient is 78 year old Caucasian female who presented about 10 weeks ago with cholecystitis and choledocholithiasis.  She underwent ERCP on 11/26/2018.  CBD could not be cannulated.  Pancreatic stent was left in place for prophylaxis.  She went on to have cholecystectomy.  She underwent second ERCP on 12/02/2018.  Bile duct was cannulated without difficulty this time.  Sphincterotomy was performed.  She had 5 stones in the common hepatic duct and intrahepatic ducts.  The stones could not be retrieved.  Therefore biliary stent was left in place.  Patient says she is been having fleeting epigastric cramping.  She was seen in the office last month.  She was tried on Levsin but it did not help.  She states pain does not last too long and by the time she takes the medication pain is gone.  Pain is not preventing her from eating.  She denies nausea vomiting fever chills.  She states she has stress test and no abnormality was noted.  She is on aspirin last dose was 2 or 3 days ago.  Past Medical History:  Diagnosis Date  . COPD (chronic obstructive pulmonary disease) ([place])   . Hyperlipidemia   . Hypothyroidism 01/02/2017    Past Surgical History:  Procedure Laterality Date  . BILIARY STENT PLACEMENT  11/26/2018   Procedure: BILIARY STENT PLACEMENT (PANCREATIC);  Surgeon: Rogene Houston, MD;  [facility];  Service: Endoscopy;;  . BILIARY STENT PLACEMENT  12/02/2018   Procedure: BILIARY STENT PLACEMENT;  Surgeon: Rogene Houston, MD;  [facility];  Service: Endoscopy;;  . CHOLECYSTECTOMY N/A 11/27/2018   Procedure: LAPAROSCOPIC CHOLECYSTECTOMY;  Surgeon: Virl Cagey, MD;  Location: [facility];  Service: General;  Laterality: N/A;  . ERCP N/A 11/26/2018   Procedure: UNSUCCESSFUL ENDOSCOPIC RETROGRADE CHOLANGIOPANCREATOGRAPHY (ERCP) BECAUSE OF SMALL ORIFICE AND EDEMA;   Surgeon: Rogene Houston, MD;  [facility];  Service: Endoscopy;  Laterality: N/A;  . ERCP N/A 12/02/2018   Procedure: ENDOSCOPIC RETROGRADE CHOLANGIOPANCREATOGRAPHY (ERCP);  Surgeon: Rogene Houston, MD;  [facility];  Service: Endoscopy;  Laterality: N/A;  . REMOVAL OF STONES  12/02/2018   Procedure: BALLOON DILATION;  Surgeon: Rogene Houston, MD;  [facility];  Service: Endoscopy;;  . SPHINCTEROTOMY  12/02/2018   Procedure: BILIARY SPHINCTEROTOMY;  Surgeon: Rogene Houston, MD;  [facility];  Service: Endoscopy;;  . TUBAL LIGATION      Family History  Problem Relation Age of Onset  . CAD Mother   . CAD Father   . CAD Brother        MI at age 3   Social History:  reports that she quit smoking about 2 months ago. Her smoking use included cigarettes. She has a 10.00 pack-year smoking history. She has never used smokeless tobacco. She reports current alcohol use. She reports that she does not use drugs.  Allergies: No Known Allergies  Medications Prior to Admission  Medication Sig Dispense Refill  . acetaminophen (TYLENOL) 500 MG tablet Take 500 mg by mouth every 6 (six) hours as needed for moderate pain or headache.    [place] Kitchen aspirin EC 81 MG tablet Take 1 tablet (81 mg total) by mouth daily.    Marland Kitchen atorvastatin (LIPITOR) 40 MG tablet Take 40 mg by mouth every evening.     . budesonide-formoterol (  SYMBICORT) 80-4.5 MCG/ACT inhaler Inhale 1 puff into the lungs daily.    . Calcium Carb-Cholecalciferol (CALCIUM 600 + D PO) Take 1 tablet by mouth daily.    . Coenzyme Q10 (CO Q 10) 100 MG CAPS Take 100 mg by mouth daily.    . Cyanocobalamin (B-12) 1000 MCG/ML KIT Inject 1,000 mcg as directed every 30 (thirty) days.    Marland Kitchen docusate sodium (COLACE) 100 MG capsule Take 1 capsule (100 mg total) by mouth 2 (two) times daily. (Patient taking differently: Take 100 mg by mouth daily. ) 60 capsule 2  . levothyroxine (SYNTHROID) 88 MCG tablet Take 88 mcg by  mouth daily before breakfast.    . Multiple Vitamin (MULTIVITAMIN) tablet Take 1 tablet by mouth daily.    . Omega-3 Fatty Acids (FISH OIL) 1000 MG CAPS Take 1,000 mg by mouth daily.     Marland Kitchen omeprazole (PRILOSEC) 20 MG capsule Take 20 mg by mouth daily.    . Denosumab (PROLIA [place]) Inject 1 Dose as directed every 6 (six) months.     . hyoscyamine (LEVSIN SL) 0.125 MG SL tablet Place 1 tablet (0.125 mg total) under the tongue 4 (four) times daily as needed. (Patient not taking: Reported on 02/04/2019) 30 tablet 1    Results for orders placed or performed during the hospital encounter of 02/12/19 (from the past 48 hour(s))  CBC with Differential/Platelet     Status: None   Collection Time: 02/12/19  8:51 AM  Result Value Ref Range   WBC 8.4 4.0 - 10.5 K/uL   RBC 5.04 3.87 - 5.11 MIL/uL   Hemoglobin 13.5 12.0 - 15.0 g/dL   HCT 43.3 36.0 - 46.0 %   MCV 85.9 80.0 - 100.0 fL   MCH 26.8 26.0 - 34.0 pg   MCHC 31.2 30.0 - 36.0 g/dL   RDW 13.7 11.5 - 15.5 %   Platelets 360 150 - 400 K/uL   nRBC 0.0 0.0 - 0.2 %   Neutrophils Relative % 49 %   Neutro Abs 4.1 1.7 - 7.7 K/uL   Lymphocytes Relative 39 %   Lymphs Abs 3.2 0.7 - 4.0 K/uL   Monocytes Relative 10 %   Monocytes Absolute 0.8 0.1 - 1.0 K/uL   Eosinophils Relative 1 %   Eosinophils Absolute 0.1 0.0 - 0.5 K/uL   Basophils Relative 1 %   Basophils Absolute 0.1 0.0 - 0.1 K/uL   Immature Granulocytes 0 %   Abs Immature Granulocytes 0.02 0.00 - 0.07 K/uL    Comment: Performed at [facility], [address], [place], [place] [address]  Comprehensive metabolic panel     Status: Abnormal   Collection Time: 02/12/19  8:51 AM  Result Value Ref Range   Sodium 140 135 - 145 mmol/L   Potassium 4.7 3.5 - 5.1 mmol/L   Chloride 107 98 - 111 mmol/L   CO2 25 22 - 32 mmol/L   Glucose, Bld 101 (H) 70 - 99 mg/dL   BUN 13 8 - 23 mg/dL   Creatinine, Ser 0.72 0.44 - 1.00 mg/dL   Calcium 9.5 8.9 - 10.3 mg/dL   Total Protein 7.3 6.5 - 8.1 g/dL   Albumin 3.9  3.5 - 5.0 g/dL   AST 20 15 - 41 U/L   ALT 15 0 - 44 U/L   Alkaline Phosphatase 69 38 - 126 U/L   Total Bilirubin 0.5 0.3 - 1.2 mg/dL   GFR calc non Af Amer >60 >60 mL/min   GFR  calc Af Amer >60 >60 mL/min   Anion gap 8 5 - 15    Comment: Performed at [facility], [address] [place], [place] [address]  Lipase, blood     Status: None   Collection Time: 02/12/19  8:51 AM  Result Value Ref Range   Lipase 37 11 - 51 U/L    Comment: Performed at [facility], [address] [place], [place] [address]   No results found.  ROS  Blood pressure (!) 134/55, temperature 98.1 F (36.7 C), temperature source Oral, resp. rate 18, SpO2 98 %. Physical Exam  Constitutional:  Well-developed thin Caucasian female in NAD.  HENT:  Mouth/Throat: Oropharynx is clear and moist.  Eyes: Conjunctivae are normal. No scleral icterus.  Neck: No thyromegaly present.  Cardiovascular: Normal rate and regular rhythm.  Murmur heard. Grade 2/6 systolic ejection murmur best heard at aortic area.  Respiratory: Effort normal and breath sounds normal.  GI:  Abdomen is flat soft and nontender with organomegaly or masses.  Musculoskeletal:        General: No edema.  Lymphadenopathy:    She has no cervical adenopathy.  Neurological: She is alert.  Skin: Skin is warm and dry.     Assessment/Plan Choledocholithiasis. Stones could not be removed on prior ERCP therefore biliary stent was left in place. CBC and LFTs normal. ERCP with stent and stone removal.  Hildred Laser, MD 02/13/2019, 9:15 AM

## 2019-02-13 NOTE — Transfer of Care (Signed)
Immediate Anesthesia Transfer of Care Note  Patient: Sherry Bridges  Procedure(s) Performed: ENDOSCOPIC RETROGRADE CHOLANGIOPANCREATOGRAPHY (ERCP) (N/A Esophagus) STENT REMOVAL (N/A Esophagus)  Patient Location: PACU  Anesthesia Type:General  Level of Consciousness: awake, alert , oriented and patient cooperative  Airway & Oxygen Therapy: Patient Spontanous Breathing  Post-op Assessment: Report given to RN and Post -op Vital signs reviewed and stable  Post vital signs: Reviewed and stable  Last Vitals:  Vitals Value Taken Time  BP    Temp    Pulse 86 02/13/19 1125  Resp 22 02/13/19 1125  SpO2 96 % 02/13/19 1125  Vitals shown include unvalidated device data.  Last Pain:  Vitals:   02/13/19 0853  TempSrc: Oral  PainSc: 0-No pain      Patients Stated Pain Goal: 8 (31/49/70 2637)  Complications: No apparent anesthesia complications

## 2019-02-13 NOTE — Progress Notes (Signed)
Brief ERCP note  Limited view of esophagus and stomach was normal Biliary stent in place. Stent removed with polypectomy snare.  Examination revealed occluded stent containing yellowish sludge. Mildly dilated CBD and CHD without filling defects. Dormia basket passed through bile duct once but think removed. Occlusion cholangiogram obtained with stone balloon extractor.  Balloon trolled to bile duct from the level of hilum with removal of some debris but no stones. 10 mm balloon passed across ampulla without difficulty. Prior sphincterotomy felt to be adequate.

## 2019-02-13 NOTE — Anesthesia Preprocedure Evaluation (Signed)
Anesthesia Evaluation  Patient identified by MRN, date of birth, ID band Patient awake    Reviewed: Allergy & Precautions, NPO status , Patient's Chart, lab work & pertinent test results  Airway Mallampati: II  TM Distance: >3 FB Neck ROM: Full    Dental no notable dental hx. (+) Upper Dentures, Lower Dentures   Pulmonary shortness of breath and with exertion, COPD,  COPD inhaler, former smoker,    Pulmonary exam normal breath sounds clear to auscultation       Cardiovascular Exercise Tolerance: Good hypertension, + CAD  Normal cardiovascular examI Rhythm:Regular Rate:Normal     Neuro/Psych negative neurological ROS  negative psych ROS   GI/Hepatic negative GI ROS, Neg liver ROS, Here for ERCP vs Stent removal    Endo/Other  Hypothyroidism   Renal/GU negative Renal ROS  negative genitourinary   Musculoskeletal negative musculoskeletal ROS (+)   Abdominal   Peds negative pediatric ROS (+)  Hematology negative hematology ROS (+)   Anesthesia Other Findings   Reproductive/Obstetrics negative OB ROS                             Anesthesia Physical Anesthesia Plan  ASA: III  Anesthesia Plan: General   Post-op Pain Management:    Induction: Intravenous  PONV Risk Score and Plan: 3 and Ondansetron, Dexamethasone and Treatment may vary due to age or medical condition  Airway Management Planned: Oral ETT  Additional Equipment:   Intra-op Plan:   Post-operative Plan: Extubation in OR  Informed Consent: I have reviewed the patients History and Physical, chart, labs and discussed the procedure including the risks, benefits and alternatives for the proposed anesthesia with the patient or authorized representative who has indicated his/her understanding and acceptance.     Dental advisory given  Plan Discussed with: CRNA  Anesthesia Plan Comments: (Plan Full PPE use  Plan GETA d/w  pt -WTP with same )        Anesthesia Quick Evaluation

## 2019-02-13 NOTE — Anesthesia Procedure Notes (Signed)
Procedure Name: Intubation Date/Time: 02/13/2019 10:30 AM Performed by: Jonna Munro, CRNA Pre-anesthesia Checklist: Patient identified, Emergency Drugs available, Suction available, Patient being monitored and Timeout performed Patient Re-evaluated:Patient Re-evaluated prior to induction Oxygen Delivery Method: Circle system utilized Preoxygenation: Pre-oxygenation with 100% oxygen Induction Type: Rapid sequence, Cricoid Pressure applied and IV induction Tube size: 7.0 mm Number of attempts: 1 Placement Confirmation: positive ETCO2,  breath sounds checked- equal and bilateral and ETT inserted through vocal cords under direct vision Secured at: 22 cm Tube secured with: Tape Dental Injury: Teeth and Oropharynx as per pre-operative assessment

## 2019-02-13 NOTE — Discharge Instructions (Signed)
Resume usual medications including aspirin as before. Clear liquids the afternoon.  Resume usual diet either this evening or starting tomorrow morning. No driving for 24 hours.        Endoscopic Retrograde Cholangiopancreatogram, Care After This sheet gives you information about how to care for yourself after your procedure. Your health care provider may also give you more specific instructions. If you have problems or questions, contact your health care provider. What can I expect after the procedure? After the procedure, it is common to have:  Soreness in your throat.  Nausea.  Bloating.  Dizziness.  Tiredness (fatigue). Follow these instructions at home:   Take over-the-counter and prescription medicines only as told by your health care provider.  Do not drive for 24 hours if you were given a medicine to help you relax (sedative) during your procedure. Have someone stay with you for 24 hours after the procedure.  Return to your normal activities as told by your health care provider. Ask your health care provider what activities are safe for you.  Return to eating what you normally do as soon as you feel well enough or as told by your health care provider.  Keep all follow-up visits as told by your health care provider. This is important. Contact a health care provider if:  You have pain in your abdomen that does not get better with medicine.  You develop signs of infection, such as: ? Chills. ? Feeling unwell. Get help right away if:  You have difficulty swallowing.  You have worsening pain in your throat, chest, or abdomen.  You vomit bright red blood or a substance that looks like coffee grounds.  You have bloody or very black stools.  You have a fever.  You have a sudden increase in swelling (bloating) in your abdomen. Summary  After the procedure, it is common to feel tired and to have some discomfort in your throat.  Contact your health care  provider if you have signs of infection--such as chills or feeling unwell--or if you have pain that does not improve with medicine.  Get help right away if you have trouble swallowing, worsening pain, bloody or black vomit, bloody or black stools, a fever, or increased swelling in your abdomen.  Keep all follow-up visits as told by your health care provider. This is important. This information is not intended to replace advice given to you by your health care provider. Make sure you discuss any questions you have with your health care provider. Document Released: 04/01/2013 Document Revised: 05/24/2017 Document Reviewed: 04/30/2016 Elsevier Patient Education  2020 [place] Anesthesia, Adult, Care After This sheet gives you information about how to care for yourself after your procedure. Your health care provider may also give you more specific instructions. If you have problems or questions, contact your health care provider. What can I expect after the procedure? After the procedure, the following side effects are common:  Pain or discomfort at the IV site.  Nausea.  Vomiting.  Sore throat.  Trouble concentrating.  Feeling cold or chills.  Weak or tired.  Sleepiness and fatigue.  Soreness and body aches. These side effects can affect parts of the body that were not involved in surgery. Follow these instructions at home:  For at least 24 hours after the procedure:  Have a responsible adult stay with you. It is important to have someone help care for you until you are awake and alert.  Rest as  needed.  Do not: ? Participate in activities in which you could fall or become injured. ? Drive. ? Use heavy machinery. ? Drink alcohol. ? Take sleeping pills or medicines that cause drowsiness. ? Make important decisions or sign legal documents. ? Take care of children on your own. Eating and drinking  Follow any instructions from your health care provider  about eating or drinking restrictions.  When you feel hungry, start by eating small amounts of foods that are soft and easy to digest (bland), such as toast. Gradually return to your regular diet.  Drink enough fluid to keep your urine pale yellow.  If you vomit, rehydrate by drinking water, juice, or clear broth. General instructions  If you have sleep apnea, surgery and certain medicines can increase your risk for breathing problems. Follow instructions from your health care provider about wearing your sleep device: ? Anytime you are sleeping, including during daytime naps. ? While taking prescription pain medicines, sleeping medicines, or medicines that make you drowsy.  Return to your normal activities as told by your health care provider. Ask your health care provider what activities are safe for you.  Take over-the-counter and prescription medicines only as told by your health care provider.  If you smoke, do not smoke without supervision.  Keep all follow-up visits as told by your health care provider. This is important. Contact a health care provider if:  You have nausea or vomiting that does not get better with medicine.  You cannot eat or drink without vomiting.  You have pain that does not get better with medicine.  You are unable to pass urine.  You develop a skin rash.  You have a fever.  You have redness around your IV site that gets worse. Get help right away if:  You have difficulty breathing.  You have chest pain.  You have blood in your urine or stool, or you vomit blood. Summary  After the procedure, it is common to have a sore throat or nausea. It is also common to feel tired.  Have a responsible adult stay with you for the first 24 hours after general anesthesia. It is important to have someone help care for you until you are awake and alert.  When you feel hungry, start by eating small amounts of foods that are soft and easy to digest (bland),  such as toast. Gradually return to your regular diet.  Drink enough fluid to keep your urine pale yellow.  Return to your normal activities as told by your health care provider. Ask your health care provider what activities are safe for you. This information is not intended to replace advice given to you by your health care provider. Make sure you discuss any questions you have with your health care provider. Document Released: 09/17/2000 Document Revised: 06/14/2017 Document Reviewed: 01/25/2017 Elsevier Patient Education  2020 Elsevier Inc.     Clear Liquid Diet, Adult A clear liquid diet is a diet that includes only liquids and semi-liquids that you can see through. You do not eat any food on this diet. Most people need to follow this diet for only a short time. You may need to follow a clear liquid diet if:  You have a problem right before or after you have surgery.  You did not eat food for a long time.  You had any of these: ? Feeling sick to your stomach (nausea). ? Throwing up (vomiting). ? Passing a watery stool (diarrhea).  You are going to  have an exam to look at parts of your digestive system.  You are going to have bowel surgery. The goals of this diet are:  To rest the stomach.  To help you clear the digestive system before an exam.  To make sure that there is enough fluid in your body.  To make sure you get some energy.  To help you get back to eating like you used to. What are tips for following this plan?  A clear liquid is a liquid or semi-liquid that you can see through when you hold it up to a light. An example of this is gelatin.  This diet does not give you all the nutrients that you need. Choose a variety of the liquids that your doctor says you can drink on this diet. That way, you will get as many nutrients as possible.  If you are not sure whether you can have certain items, ask your doctor. If you are unable to swallow a thin liquid, you will  need to thicken it before taking it. This will stop you from breathing it in (aspiration). What foods should I eat?   Water and flavored water.  Fruit juices that do not have pulp, such as cranberry juice and apple juice.  Tea and coffee without milk or cream.  Clear bouillon or broth.  Broth-based soups that have been strained.  Flavored gelatins.  Honey.  Sugar water.  Ice or frozen ice pops that do not have any milk, yogurt, fruit pieces, or fruit pulp in them.  Clear sodas.  Clear sports drinks. The items listed above may not be a complete list of what you can eat and drink. Contact a dietitian for more options. What foods should I avoid?  Juices that have pulp.  Milk.  Cream or cream-based soups.  Yogurt.  Normal foods that are not clear liquids or semi-liquids. The items listed above may not be a complete list of what you should not eat and drink. Contact a dietitian for options. Questions to ask your health care provider  How long do I need to follow this diet?  Are there any medicines that I should change while on this diet? Summary  A clear liquid diet is a diet that includes only liquids and semi-liquids that you can see through.  Some goals of this diet are to rest your stomach, make sure you get enough fluid, and give you some energy.  Avoid liquids with milk, cream, or pulp while you are on this diet. This information is not intended to replace advice given to you by your health care provider. Make sure you discuss any questions you have with your health care provider. Document Released: 05/24/2008 Document Revised: 12/02/2017 Document Reviewed: 12/02/2017 Elsevier Patient Education  2020 [employer]

## 2019-02-13 NOTE — Anesthesia Postprocedure Evaluation (Signed)
Anesthesia Post Note  Patient: Jensyn H Guercio  Procedure(s) Performed: ENDOSCOPIC RETROGRADE CHOLANGIOPANCREATOGRAPHY (ERCP) (N/A Esophagus) STENT REMOVAL (N/A Esophagus)  Patient location during evaluation: PACU Anesthesia Type: General Level of consciousness: awake, oriented and patient cooperative Pain management: pain level controlled Vital Signs Assessment: post-procedure vital signs reviewed and stable Respiratory status: spontaneous breathing, nonlabored ventilation and respiratory function stable Cardiovascular status: stable Postop Assessment: no apparent nausea or vomiting Anesthetic complications: no     Last Vitals:  Vitals:   02/13/19 0853 02/13/19 1124  BP: (!) 134/55 (!) 113/46  Pulse:  86  Resp: 18 (!) 22  Temp: 36.7 C (!) 36.4 C  SpO2: 98% 96%    Last Pain:  Vitals:   02/13/19 1124  TempSrc:   PainSc: (P) 0-No pain                 Gaynell Eggleton

## 2019-02-19 ENCOUNTER — Encounter (HOSPITAL_COMMUNITY): Payer: Self-pay | Admitting: Internal Medicine

## 2019-02-27 DIAGNOSIS — I351 Nonrheumatic aortic (valve) insufficiency: Secondary | ICD-10-CM | POA: Diagnosis not present

## 2019-02-27 DIAGNOSIS — J449 Chronic obstructive pulmonary disease, unspecified: Secondary | ICD-10-CM | POA: Diagnosis not present

## 2019-02-27 DIAGNOSIS — Z716 Tobacco abuse counseling: Secondary | ICD-10-CM | POA: Diagnosis not present

## 2019-02-27 DIAGNOSIS — F17210 Nicotine dependence, cigarettes, uncomplicated: Secondary | ICD-10-CM | POA: Diagnosis not present

## 2019-03-13 DIAGNOSIS — J449 Chronic obstructive pulmonary disease, unspecified: Secondary | ICD-10-CM | POA: Diagnosis not present

## 2019-03-17 DIAGNOSIS — J449 Chronic obstructive pulmonary disease, unspecified: Secondary | ICD-10-CM | POA: Diagnosis not present

## 2019-04-03 DIAGNOSIS — Z1159 Encounter for screening for other viral diseases: Secondary | ICD-10-CM | POA: Diagnosis not present

## 2019-04-03 DIAGNOSIS — Z20828 Contact with and (suspected) exposure to other viral communicable diseases: Secondary | ICD-10-CM | POA: Diagnosis not present

## 2019-04-06 DIAGNOSIS — Z23 Encounter for immunization: Secondary | ICD-10-CM | POA: Diagnosis not present

## 2019-06-05 DIAGNOSIS — J449 Chronic obstructive pulmonary disease, unspecified: Secondary | ICD-10-CM | POA: Diagnosis not present

## 2019-06-05 DIAGNOSIS — I351 Nonrheumatic aortic (valve) insufficiency: Secondary | ICD-10-CM | POA: Diagnosis not present

## 2019-06-05 DIAGNOSIS — F17210 Nicotine dependence, cigarettes, uncomplicated: Secondary | ICD-10-CM | POA: Diagnosis not present

## 2019-06-05 DIAGNOSIS — Z716 Tobacco abuse counseling: Secondary | ICD-10-CM | POA: Diagnosis not present

## 2019-07-02 DIAGNOSIS — I351 Nonrheumatic aortic (valve) insufficiency: Secondary | ICD-10-CM | POA: Diagnosis not present

## 2019-07-02 DIAGNOSIS — E039 Hypothyroidism, unspecified: Secondary | ICD-10-CM | POA: Diagnosis not present

## 2019-07-02 DIAGNOSIS — J449 Chronic obstructive pulmonary disease, unspecified: Secondary | ICD-10-CM | POA: Diagnosis not present

## 2019-07-02 DIAGNOSIS — E782 Mixed hyperlipidemia: Secondary | ICD-10-CM | POA: Diagnosis not present

## 2019-07-17 DIAGNOSIS — E782 Mixed hyperlipidemia: Secondary | ICD-10-CM | POA: Diagnosis not present

## 2019-07-17 DIAGNOSIS — M810 Age-related osteoporosis without current pathological fracture: Secondary | ICD-10-CM | POA: Diagnosis not present

## 2019-07-17 DIAGNOSIS — E039 Hypothyroidism, unspecified: Secondary | ICD-10-CM | POA: Diagnosis not present

## 2019-08-05 DIAGNOSIS — Z23 Encounter for immunization: Secondary | ICD-10-CM | POA: Diagnosis not present

## 2019-09-02 DIAGNOSIS — Z23 Encounter for immunization: Secondary | ICD-10-CM | POA: Diagnosis not present

## 2019-11-05 DIAGNOSIS — Z0000 Encounter for general adult medical examination without abnormal findings: Secondary | ICD-10-CM | POA: Diagnosis not present

## 2020-01-05 DIAGNOSIS — D509 Iron deficiency anemia, unspecified: Secondary | ICD-10-CM | POA: Diagnosis not present

## 2020-01-05 DIAGNOSIS — E782 Mixed hyperlipidemia: Secondary | ICD-10-CM | POA: Diagnosis not present

## 2020-01-05 DIAGNOSIS — E538 Deficiency of other specified B group vitamins: Secondary | ICD-10-CM | POA: Diagnosis not present

## 2020-01-05 DIAGNOSIS — E039 Hypothyroidism, unspecified: Secondary | ICD-10-CM | POA: Diagnosis not present

## 2020-01-15 DIAGNOSIS — M810 Age-related osteoporosis without current pathological fracture: Secondary | ICD-10-CM | POA: Diagnosis not present

## 2020-02-02 DIAGNOSIS — E785 Hyperlipidemia, unspecified: Secondary | ICD-10-CM | POA: Diagnosis not present

## 2020-02-02 DIAGNOSIS — F17200 Nicotine dependence, unspecified, uncomplicated: Secondary | ICD-10-CM | POA: Diagnosis not present

## 2020-02-02 DIAGNOSIS — I351 Nonrheumatic aortic (valve) insufficiency: Secondary | ICD-10-CM | POA: Diagnosis not present

## 2020-02-02 DIAGNOSIS — R0989 Other specified symptoms and signs involving the circulatory and respiratory systems: Secondary | ICD-10-CM | POA: Diagnosis not present

## 2020-02-02 DIAGNOSIS — I519 Heart disease, unspecified: Secondary | ICD-10-CM | POA: Diagnosis not present

## 2020-02-02 DIAGNOSIS — E039 Hypothyroidism, unspecified: Secondary | ICD-10-CM | POA: Diagnosis not present

## 2020-02-02 DIAGNOSIS — R0609 Other forms of dyspnea: Secondary | ICD-10-CM | POA: Diagnosis not present

## 2020-04-22 DIAGNOSIS — Z23 Encounter for immunization: Secondary | ICD-10-CM | POA: Diagnosis not present

## 2020-05-03 DIAGNOSIS — Z23 Encounter for immunization: Secondary | ICD-10-CM | POA: Diagnosis not present

## 2020-12-08 IMAGING — CR PORTABLE CHEST - 1 VIEW
1 series · 2 of 2 positions shown · non-contrast
Comparison: 11/30/2018

CLINICAL DATA: Shortness of breath

EXAM:
PORTABLE CHEST 1 VIEW

[Series 1: portable · 0.17mm/px · 2 of 2 slices shown]
[im 1/2]
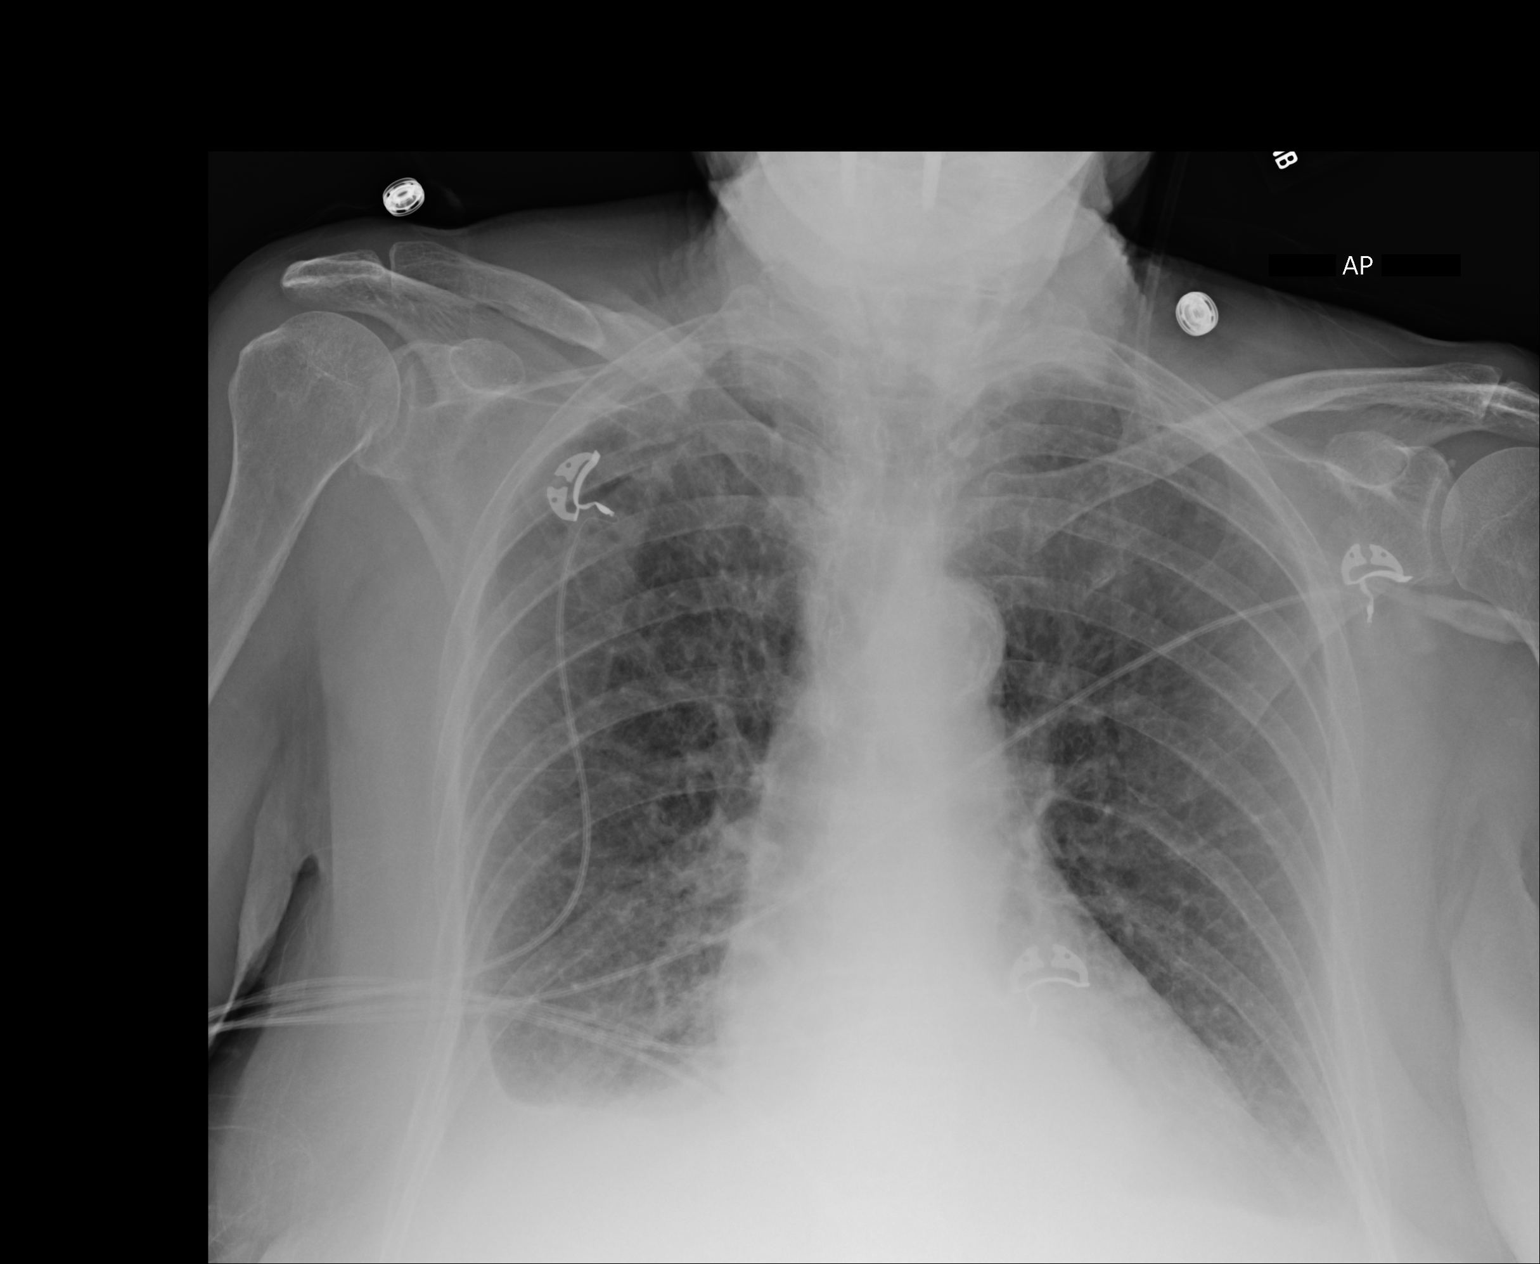
[im 2/2]
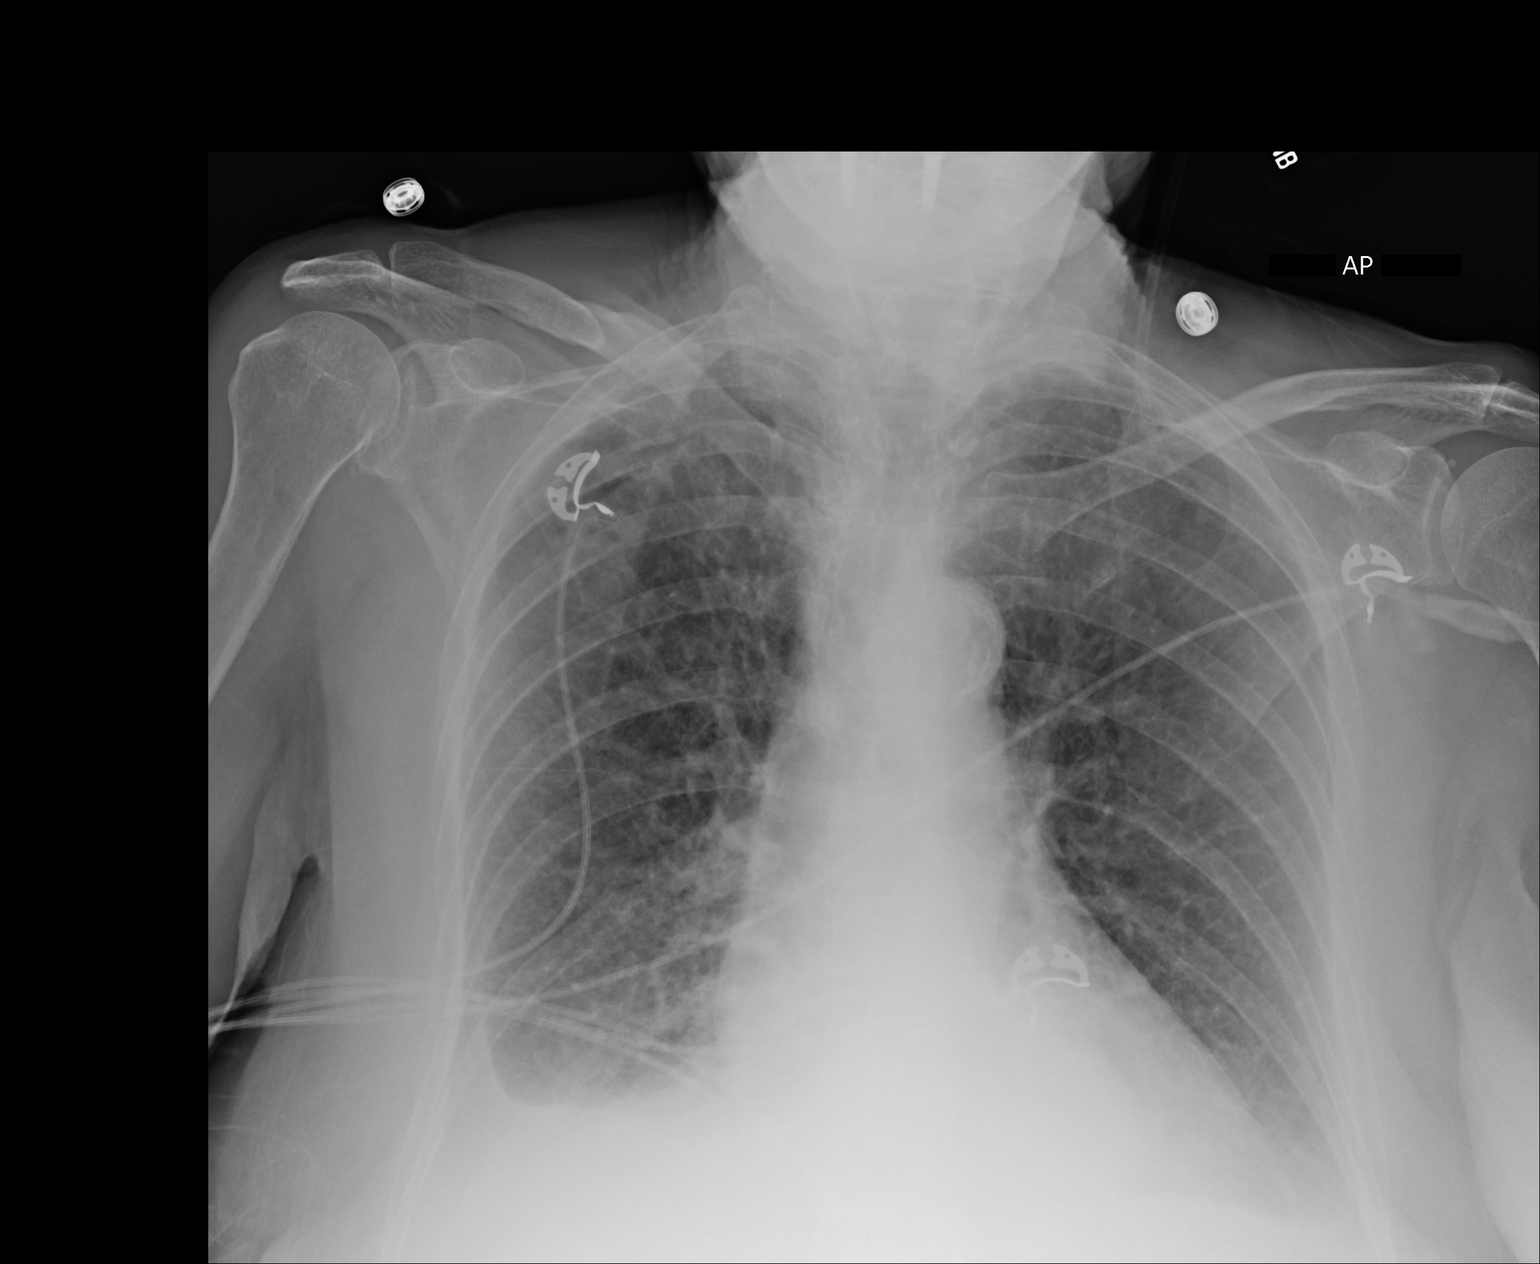

[2 of 2 positions shown; findings below may reference images not displayed]

FINDINGS: Cardiac shadow is stable. Aortic calcifications are noted. Bilateral
pleural effusions are noted right greater than left. No definitive
focal infiltrate is seen. No bony abnormality is noted.
IMPRESSION: Bilateral pleural effusions right greater than left.

## 2020-12-08 IMAGING — RF ERCP
1 series · 14 of 14 positions shown · non-contrast
Comparison: MRI 11/25/2018

CLINICAL DATA: Choledocholithiasis.

EXAM:
ERCP
TECHNIQUE: Multiple spot images obtained with the fluoroscopic device and
submitted for interpretation post-procedure.
FLUOROSCOPY TIME:  Fluoroscopy Time:  2 minutes and 12 seconds

[Series 1: run · 8 acquisitions, 14 frames shown]
[im 1/8]
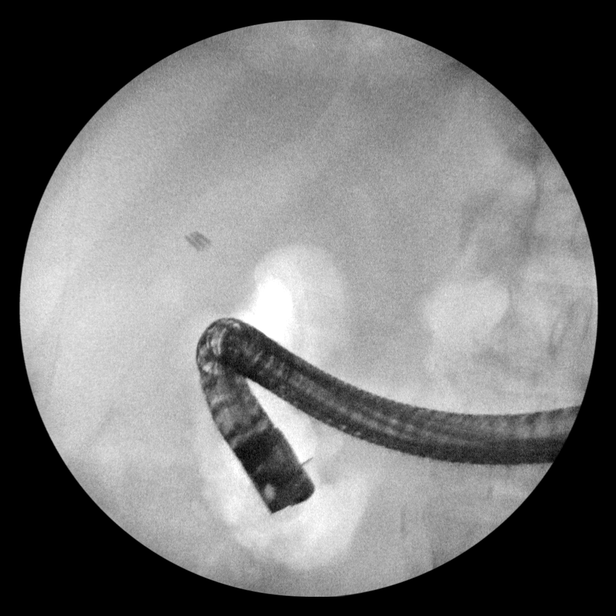
[im 2/8]
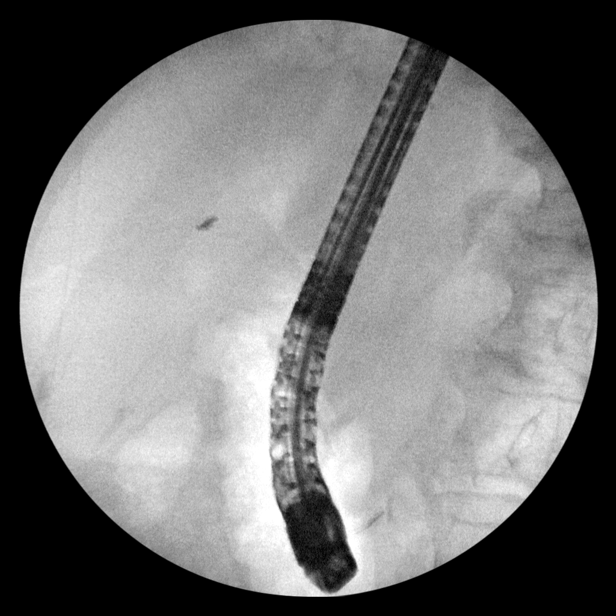
[im 3/8]
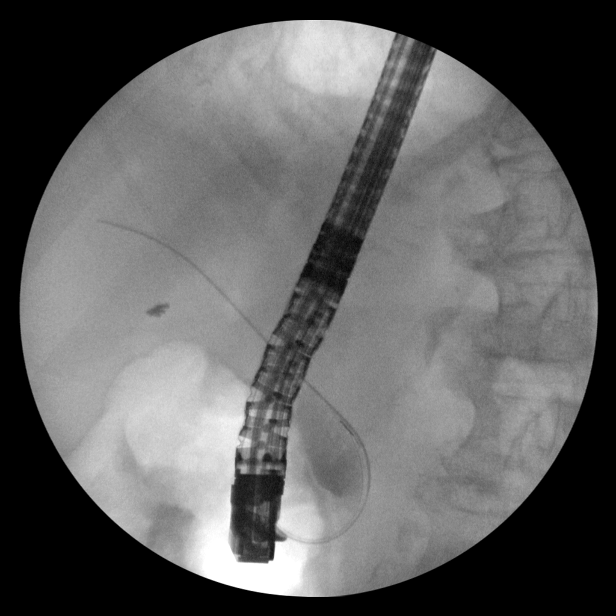
[im 3/8]
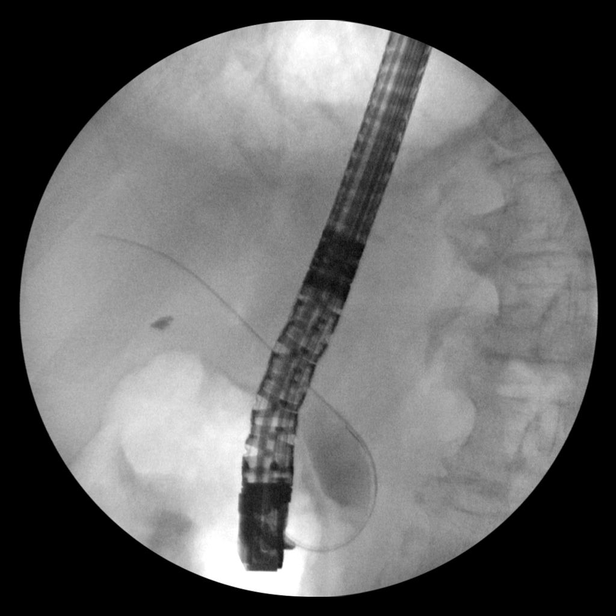
[im 3/8]
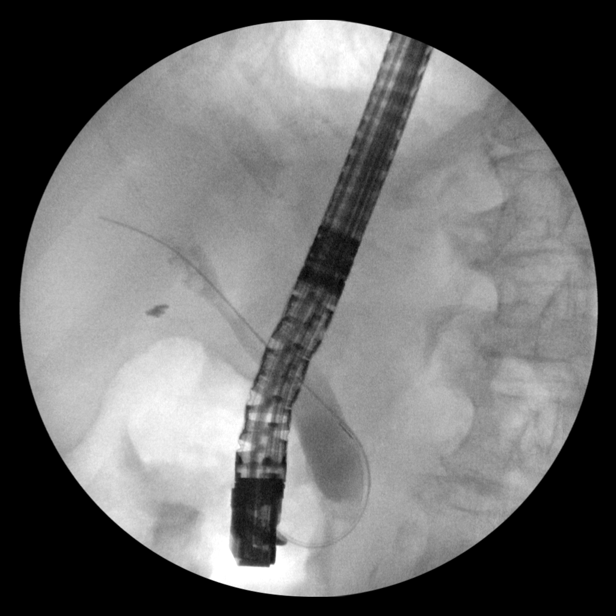
[im 3/8]
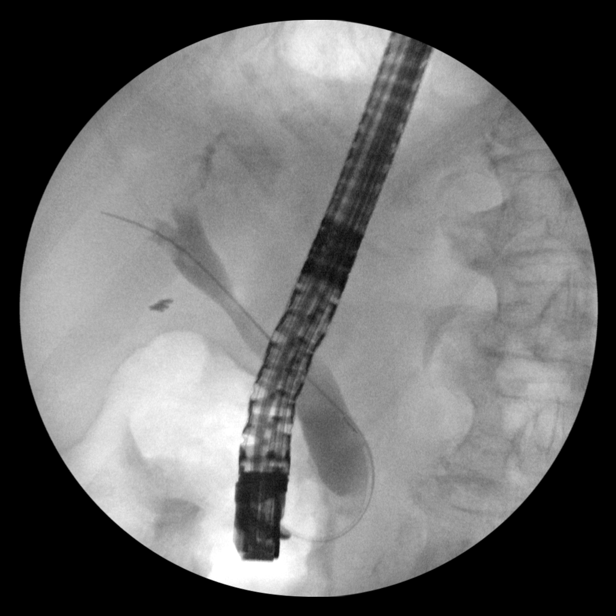
[im 4/8]
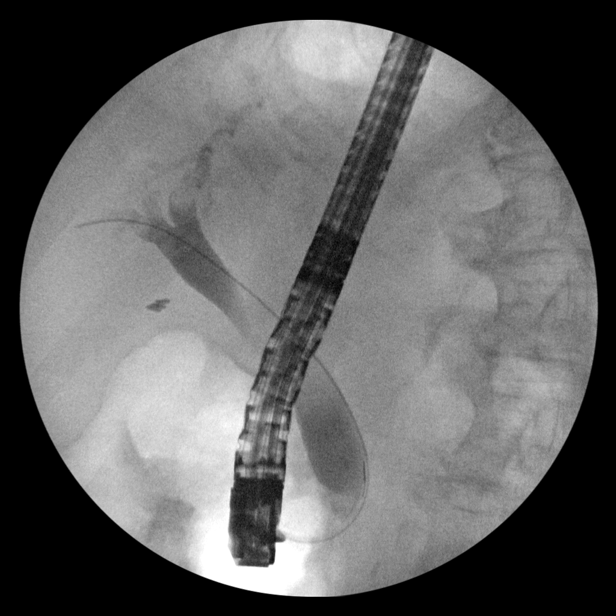
[im 5/8]
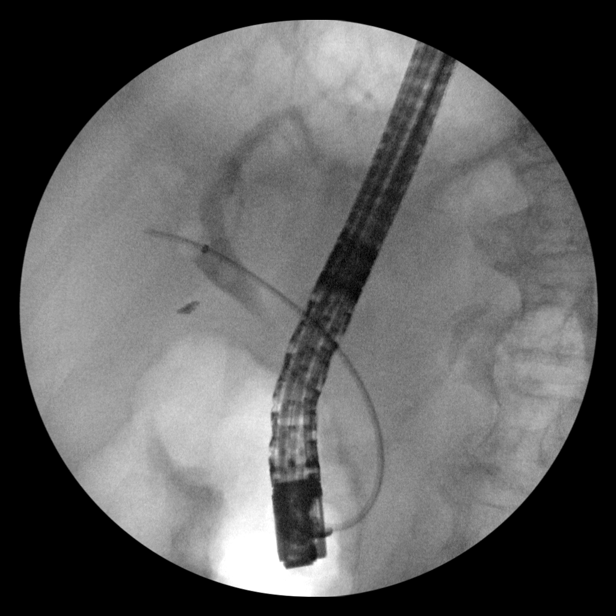
[im 6/8]
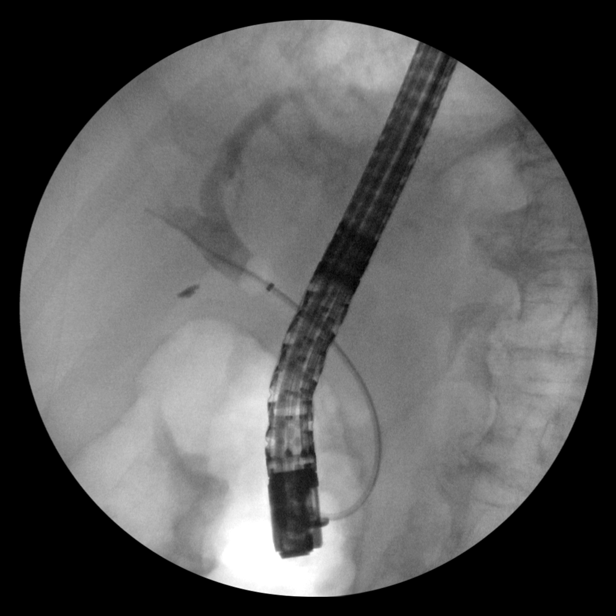
[im 6/8]
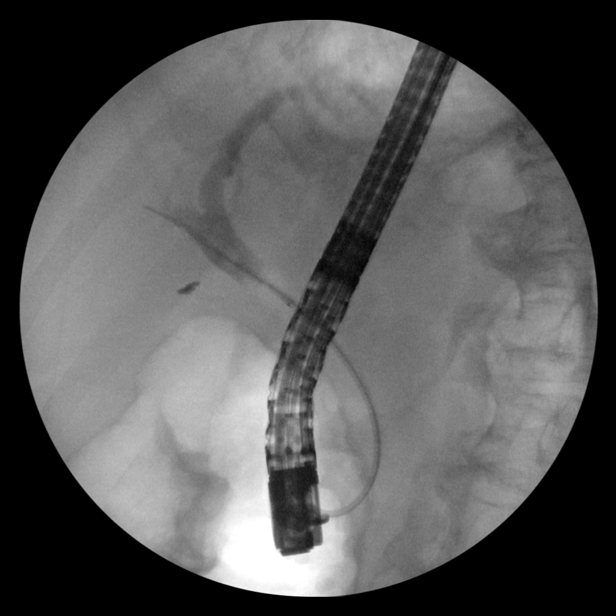
[im 6/8]
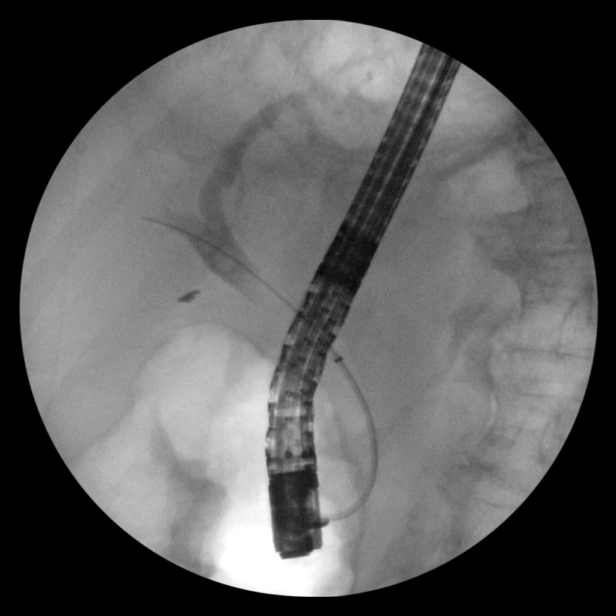
[im 6/8]
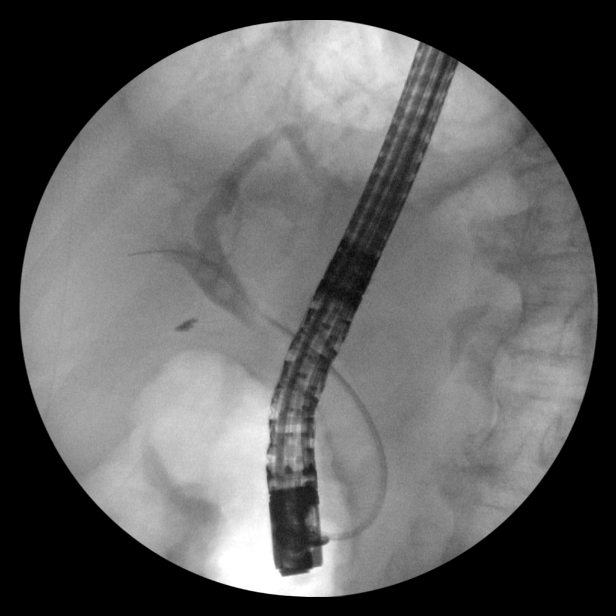
[im 7/8]
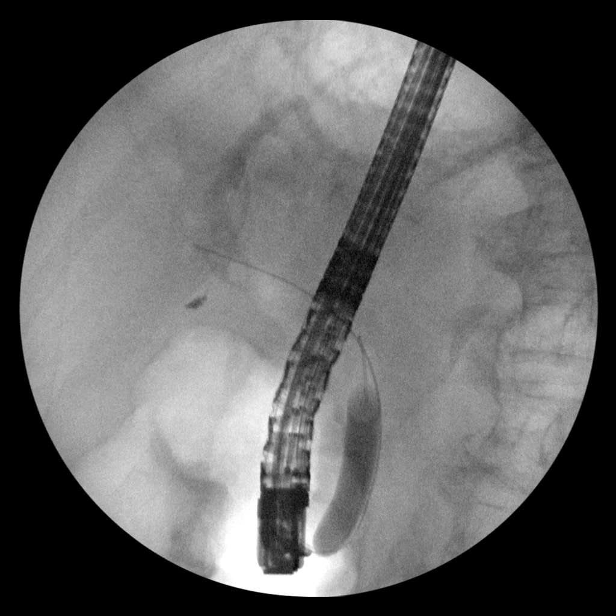
[im 8/8]
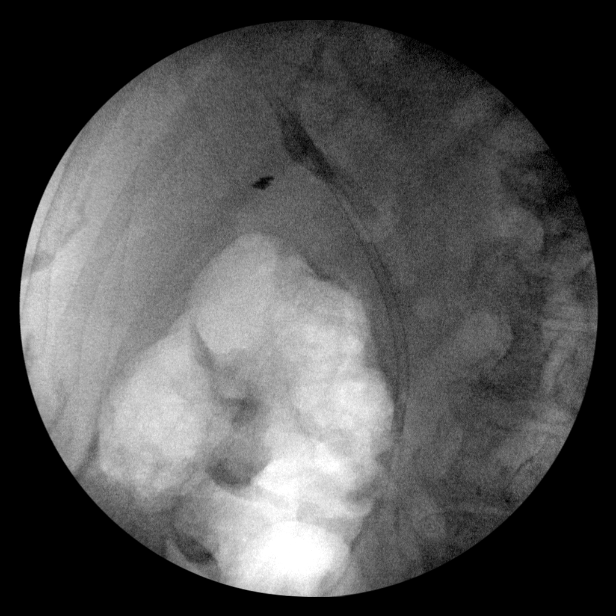

[14 of 14 positions shown; findings below may reference images not displayed]

FINDINGS: Common bile duct was cannulated and opacified with contrast.
Dilatation of the common bile duct. Filling defects in the common
hepatic duct and central intrahepatic bile ducts are compatible with
stones. Evidence for a balloon sweep. Placement of a nonmetallic
biliary stent.
IMPRESSION: Dilated biliary system with choledocholithiasis.

Placement of biliary stent.

These images were submitted for radiologic interpretation only.
Please see the procedural report for the amount of contrast and the
fluoroscopy time utilized.

## 2020-12-09 IMAGING — CR PORTABLE CHEST - 1 VIEW
1 series · 2 of 2 positions shown · non-contrast
Comparison: One-view chest x-ray 12/02/2018

CLINICAL DATA: Shortness of breath.

EXAM:
PORTABLE CHEST 1 VIEW

[Series 1: portable · 0.17mm/px · 2 of 2 slices shown]
[im 1/2]
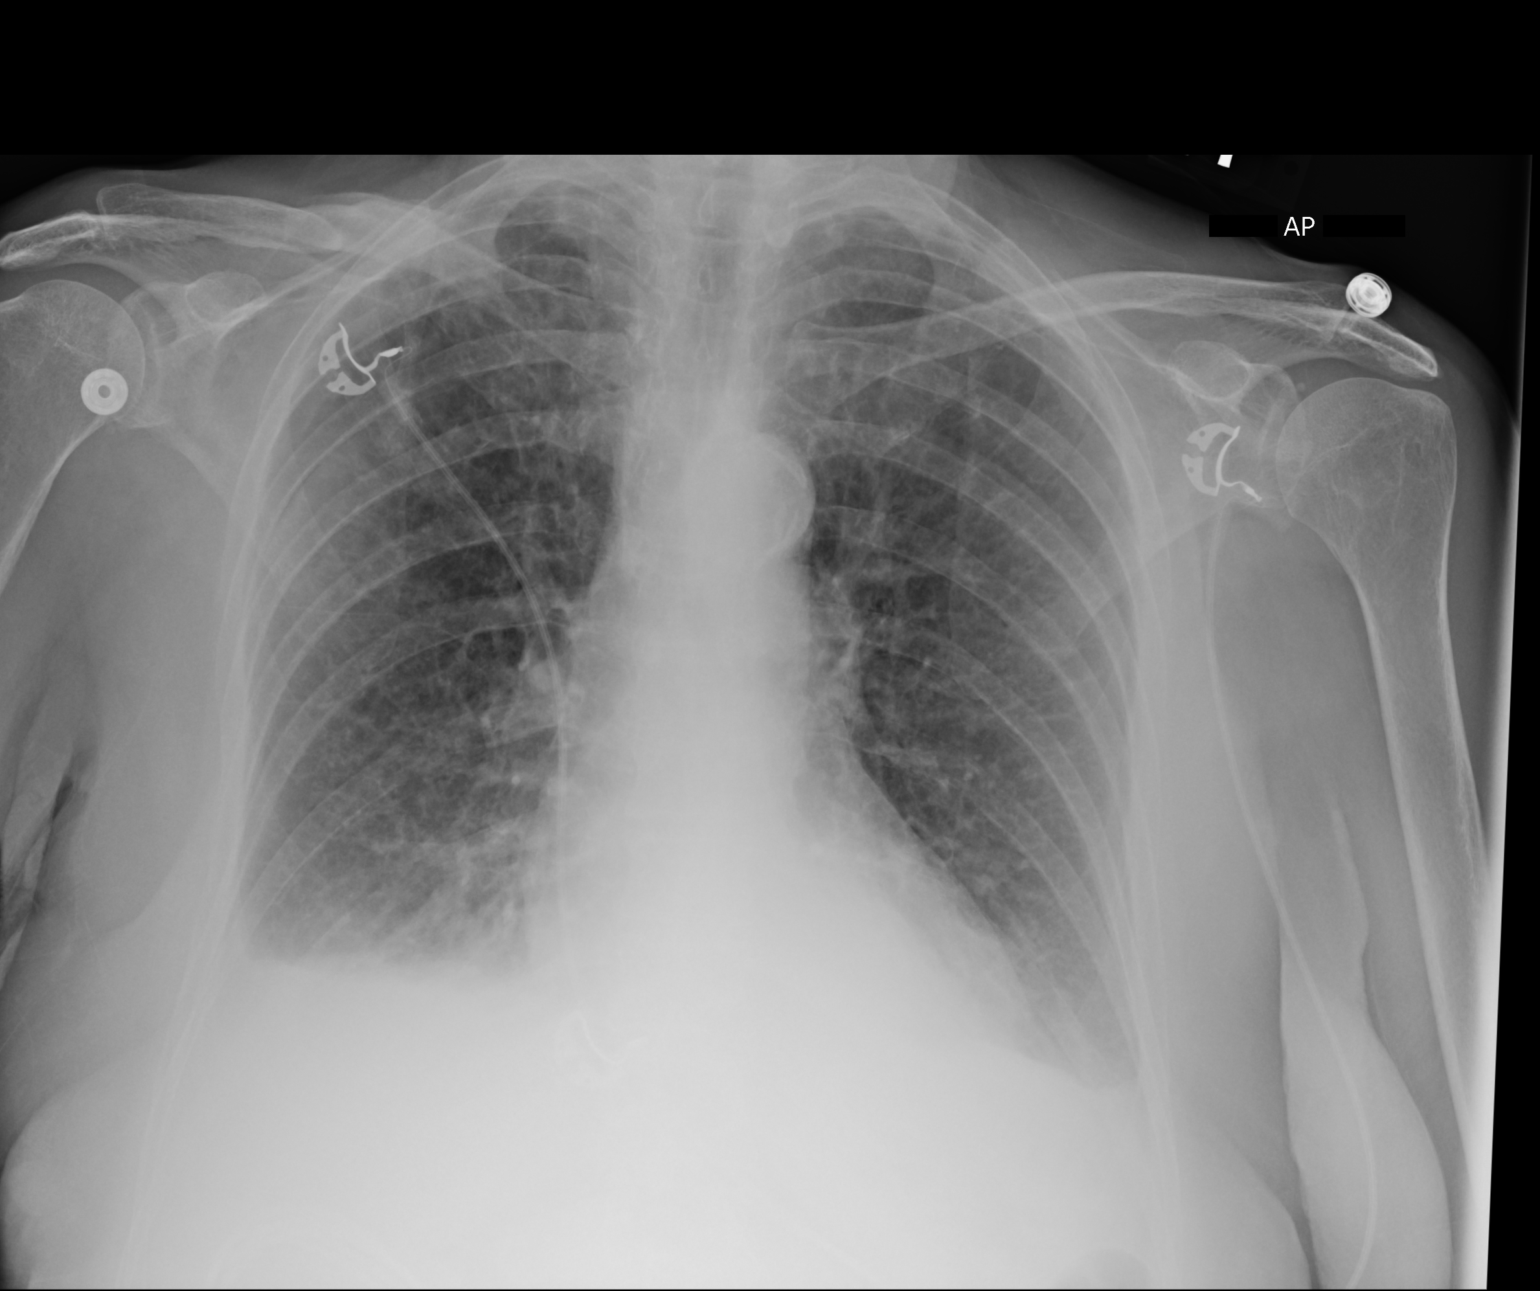
[im 2/2]
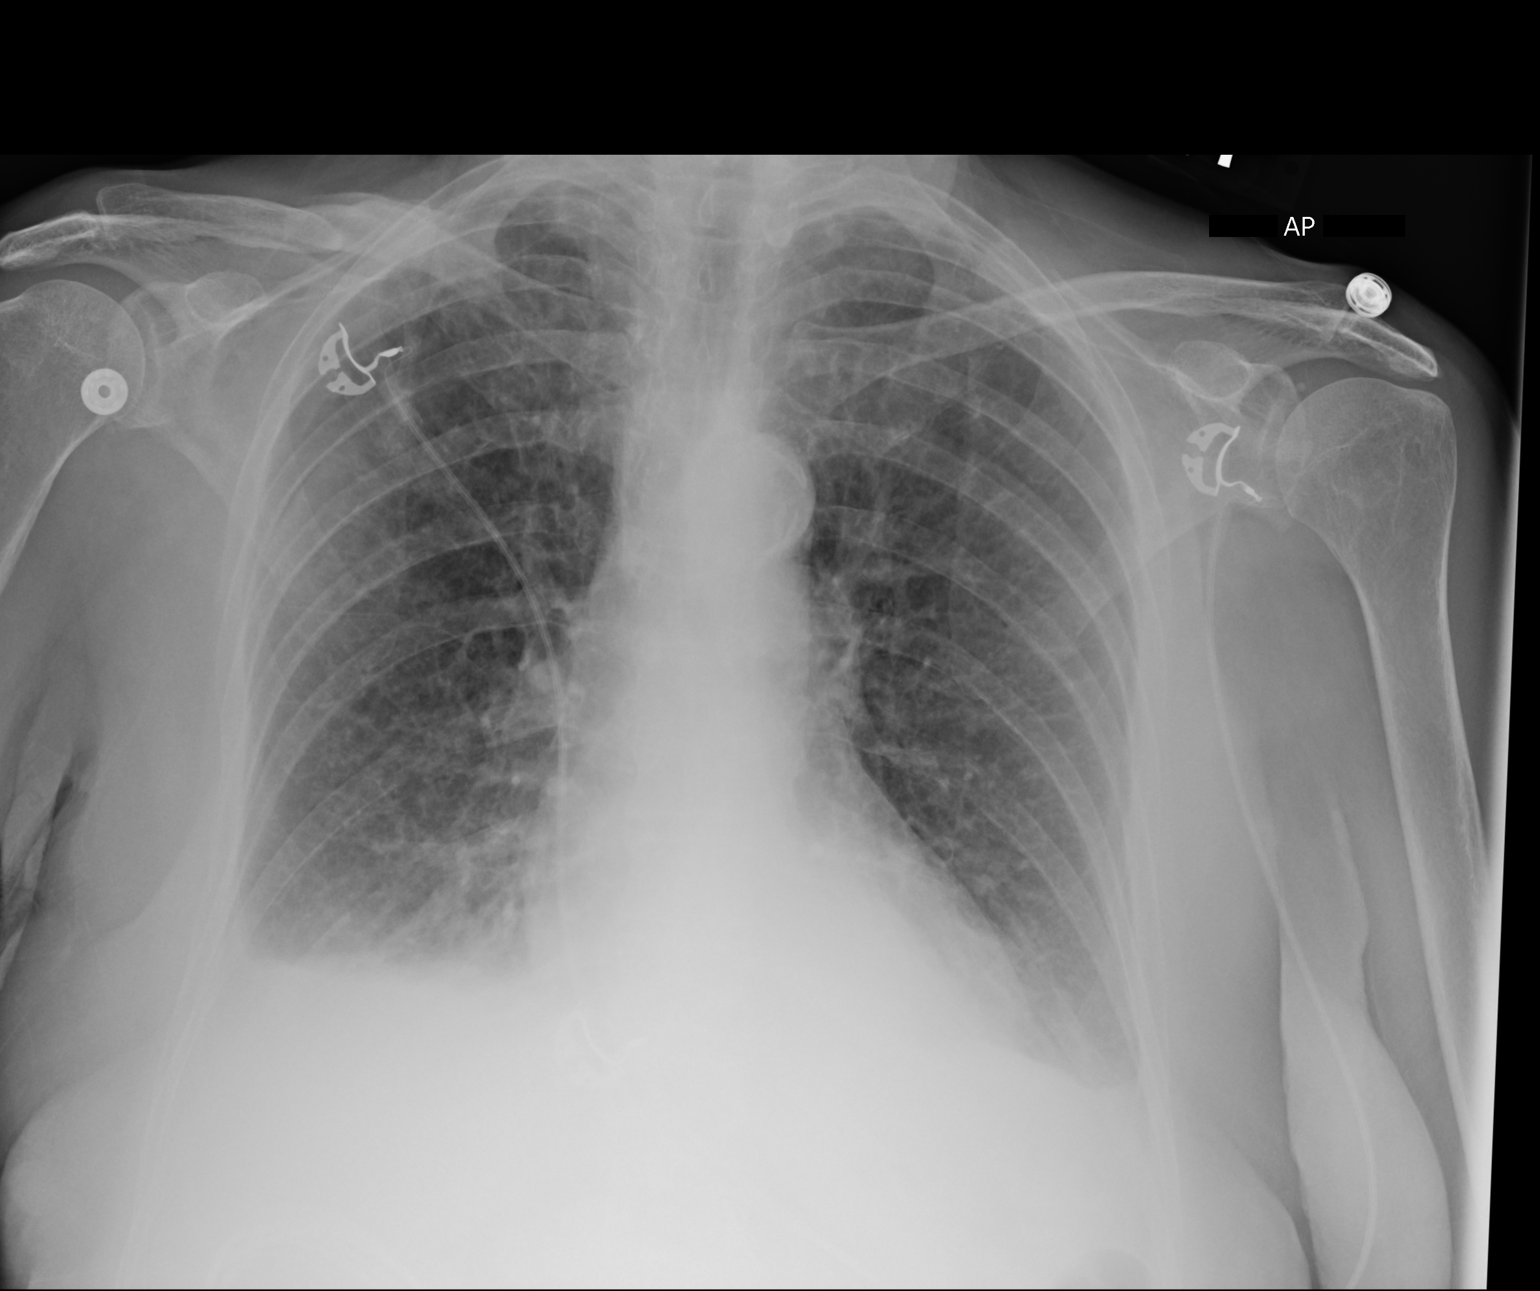

[2 of 2 positions shown; findings below may reference images not displayed]

FINDINGS: Heart size is normal. Atherosclerotic changes are noted at the
aortic arch. Chronic interstitial coarsening is again noted. Small
bilateral pleural effusions are stable. Bibasilar airspace disease
likely reflects atelectasis.
IMPRESSION: 1. Stable appearance of small bilateral pleural effusions.
2. Associated airspace disease likely reflects atelectasis.
